# Patient Record
Sex: Female | Born: 1985 | Race: Black or African American | Hispanic: No | Marital: Married | Smoking: Never smoker
Health system: Southern US, Community
[De-identification: ages and names within clinical notes are randomized; demographics above are authoritative.]

## PROBLEM LIST (undated history)

## (undated) ENCOUNTER — Emergency Department (HOSPITAL_COMMUNITY): Payer: 59

## (undated) DIAGNOSIS — E119 Type 2 diabetes mellitus without complications: Secondary | ICD-10-CM

## (undated) DIAGNOSIS — Z789 Other specified health status: Secondary | ICD-10-CM

## (undated) DIAGNOSIS — Z9889 Other specified postprocedural states: Secondary | ICD-10-CM

## (undated) DIAGNOSIS — I10 Essential (primary) hypertension: Secondary | ICD-10-CM

## (undated) HISTORY — PX: NO PAST SURGERIES: SHX2092

---

## 2007-01-25 ENCOUNTER — Ambulatory Visit: Payer: Self-pay | Admitting: Obstetrics and Gynecology

## 2007-01-25 ENCOUNTER — Inpatient Hospital Stay (HOSPITAL_COMMUNITY): Admission: AD | Admit: 2007-01-25 | Discharge: 2007-01-27 | Payer: Self-pay | Admitting: Obstetrics and Gynecology

## 2007-10-02 ENCOUNTER — Emergency Department: Payer: Self-pay | Admitting: Emergency Medicine

## 2007-11-16 ENCOUNTER — Emergency Department: Payer: Self-pay | Admitting: Emergency Medicine

## 2008-08-05 ENCOUNTER — Inpatient Hospital Stay: Payer: Self-pay | Admitting: General Practice

## 2009-07-25 ENCOUNTER — Emergency Department: Payer: Self-pay | Admitting: Internal Medicine

## 2010-09-04 ENCOUNTER — Emergency Department (HOSPITAL_COMMUNITY)
Admission: EM | Admit: 2010-09-04 | Discharge: 2010-09-04 | Disposition: A | Discharge status: Home / Self Care | Payer: Self-pay | Attending: Emergency Medicine | Admitting: Emergency Medicine

## 2010-09-04 ENCOUNTER — Emergency Department (HOSPITAL_COMMUNITY): Payer: Self-pay

## 2010-09-04 DIAGNOSIS — B9789 Other viral agents as the cause of diseases classified elsewhere: Secondary | ICD-10-CM | POA: Insufficient documentation

## 2010-09-04 DIAGNOSIS — R0989 Other specified symptoms and signs involving the circulatory and respiratory systems: Secondary | ICD-10-CM | POA: Insufficient documentation

## 2010-09-04 DIAGNOSIS — J3489 Other specified disorders of nose and nasal sinuses: Secondary | ICD-10-CM | POA: Insufficient documentation

## 2010-09-04 DIAGNOSIS — R05 Cough: Secondary | ICD-10-CM | POA: Insufficient documentation

## 2010-09-04 DIAGNOSIS — H9209 Otalgia, unspecified ear: Secondary | ICD-10-CM | POA: Insufficient documentation

## 2010-09-04 DIAGNOSIS — R509 Fever, unspecified: Secondary | ICD-10-CM | POA: Insufficient documentation

## 2010-09-04 DIAGNOSIS — R079 Chest pain, unspecified: Secondary | ICD-10-CM | POA: Insufficient documentation

## 2010-09-04 DIAGNOSIS — J029 Acute pharyngitis, unspecified: Secondary | ICD-10-CM | POA: Insufficient documentation

## 2010-09-04 DIAGNOSIS — R0609 Other forms of dyspnea: Secondary | ICD-10-CM | POA: Insufficient documentation

## 2010-09-04 DIAGNOSIS — R059 Cough, unspecified: Secondary | ICD-10-CM | POA: Insufficient documentation

## 2010-09-04 LAB — GLUCOSE, CAPILLARY: Glucose-Capillary: 93 mg/dL (ref 70–99)

## 2010-11-04 ENCOUNTER — Emergency Department (HOSPITAL_COMMUNITY)
Admission: EM | Admit: 2010-11-04 | Discharge: 2010-11-04 | Disposition: A | Discharge status: Home / Self Care | Payer: Self-pay | Attending: Emergency Medicine | Admitting: Emergency Medicine

## 2010-11-04 DIAGNOSIS — N739 Female pelvic inflammatory disease, unspecified: Secondary | ICD-10-CM | POA: Insufficient documentation

## 2010-11-04 DIAGNOSIS — R109 Unspecified abdominal pain: Secondary | ICD-10-CM | POA: Insufficient documentation

## 2010-11-04 LAB — CBC
HCT: 37.4 % (ref 36.0–46.0)
Hemoglobin: 12 g/dL (ref 12.0–15.0)
MCH: 23.2 pg — ABNORMAL LOW (ref 26.0–34.0)
MCHC: 32.1 g/dL (ref 30.0–36.0)
MCV: 72.2 fL — ABNORMAL LOW (ref 78.0–100.0)
RDW: 13.3 % (ref 11.5–15.5)

## 2010-11-04 LAB — URINALYSIS, ROUTINE W REFLEX MICROSCOPIC
Bilirubin Urine: NEGATIVE
Hgb urine dipstick: NEGATIVE
Specific Gravity, Urine: 1.03 (ref 1.005–1.030)
Urobilinogen, UA: 1 mg/dL (ref 0.0–1.0)

## 2010-11-04 LAB — WET PREP, GENITAL: Trich, Wet Prep: NONE SEEN

## 2010-11-04 LAB — DIFFERENTIAL
Basophils Relative: 0 % (ref 0–1)
Eosinophils Absolute: 0.1 10*3/uL (ref 0.0–0.7)
Lymphocytes Relative: 21 % (ref 12–46)
Neutrophils Relative %: 73 % (ref 43–77)

## 2010-11-04 LAB — URINE MICROSCOPIC-ADD ON

## 2010-11-05 LAB — GC/CHLAMYDIA PROBE AMP, GENITAL
Chlamydia, DNA Probe: NEGATIVE
GC Probe Amp, Genital: NEGATIVE

## 2011-04-30 LAB — URINALYSIS, ROUTINE W REFLEX MICROSCOPIC
Bilirubin Urine: NEGATIVE
Nitrite: NEGATIVE
Specific Gravity, Urine: 1.015
Urobilinogen, UA: 2 — ABNORMAL HIGH
pH: 7

## 2011-04-30 LAB — CBC
Hemoglobin: 10.2 — ABNORMAL LOW
MCHC: 31.6
MCHC: 31.6
MCV: 71.8 — ABNORMAL LOW
MCV: 72.7 — ABNORMAL LOW
RBC: 4.43
RBC: 4.88
RDW: 14.1 — ABNORMAL HIGH
RDW: 14.9 — ABNORMAL HIGH

## 2011-04-30 LAB — COMPREHENSIVE METABOLIC PANEL
AST: 19
CO2: 24
Calcium: 8.7
Creatinine, Ser: 0.5
GFR calc Af Amer: >60
GFR calc non Af Amer: >60
Sodium: 134 — ABNORMAL LOW
Total Protein: 6.8

## 2011-04-30 LAB — URINE MICROSCOPIC-ADD ON

## 2011-04-30 LAB — RAPID URINE DRUG SCREEN, HOSP PERFORMED
Barbiturates: NOT DETECTED
Benzodiazepines: NOT DETECTED
Opiates: NOT DETECTED

## 2011-04-30 LAB — HEMOGLOBIN A1C: Hgb A1c MFr Bld: 6.5 — ABNORMAL HIGH

## 2011-04-30 LAB — LACTATE DEHYDROGENASE: LDH: 140

## 2014-08-10 LAB — OB RESULTS CONSOLE GC/CHLAMYDIA
Chlamydia: NEGATIVE
GC PROBE AMP, GENITAL: NEGATIVE

## 2014-08-10 LAB — OB RESULTS CONSOLE GBS: STREP GROUP B AG: NEGATIVE

## 2014-12-10 LAB — OB RESULTS CONSOLE VARICELLA ZOSTER ANTIBODY, IGG: VARICELLA IGG: IMMUNE

## 2014-12-10 LAB — OB RESULTS CONSOLE RUBELLA ANTIBODY, IGM: Rubella: IMMUNE

## 2014-12-10 LAB — OB RESULTS CONSOLE HIV ANTIBODY (ROUTINE TESTING): HIV: NONREACTIVE

## 2014-12-10 LAB — OB RESULTS CONSOLE RPR: RPR: NONREACTIVE

## 2014-12-10 LAB — OB RESULTS CONSOLE ABO/RH: RH TYPE: POSITIVE

## 2014-12-10 LAB — OB RESULTS CONSOLE GC/CHLAMYDIA
Chlamydia: NEGATIVE
Gonorrhea: NEGATIVE

## 2014-12-10 LAB — OB RESULTS CONSOLE HEPATITIS B SURFACE ANTIGEN: HEP B S AG: NEGATIVE

## 2015-03-21 ENCOUNTER — Ambulatory Visit: Payer: Self-pay | Admitting: *Deleted

## 2015-07-03 ENCOUNTER — Ambulatory Visit: Payer: No Typology Code available for payment source | Admitting: Dietician

## 2015-07-16 NOTE — L&D Delivery Note (Signed)
Delivery Note At 6:41 AM a viable female was delivered via Vaginal, Spontaneous Delivery (Presentation: OP to ROP).  APGAR: 8, 9; weight  .   Placenta status: Intact, Spontaneous.  Cord: 3 vessels with the following complications: None.  Cord pH: NA  Called to see patient.  Mom pushed to delivery viable female infant.  The head delivered followed by shoulders, which were tight but delivered within 30 seconds, and the rest of the body.  No nuchal cord noted.  Baby to mom's chest.  Cord clamped and cut after > 1 min delay.  Cord blood obtained.  Placenta delivered spontaneously, intact, with a 3-vessel cord.  All counts correct.  Hemostasis obtained with IV pitocin and fundal massage. EBL 175 mL.    Anesthesia: Epidural  Episiotomy: None Lacerations: None Est. Blood Loss (mL): 175  Mom to postpartum.  Baby to Couplet care / Skin to Skin.  Tresea Mall, CNM  Dr Elesa Massed was present for observation/assistance with the delivery.

## 2015-08-11 ENCOUNTER — Encounter: Payer: Self-pay | Admitting: *Deleted

## 2015-08-11 ENCOUNTER — Observation Stay
Admission: RE | Admit: 2015-08-11 | Discharge: 2015-08-11 | Disposition: A | Discharge status: Home / Self Care | Payer: Medicaid Other | Attending: Obstetrics and Gynecology | Admitting: Obstetrics and Gynecology

## 2015-08-11 DIAGNOSIS — O24419 Gestational diabetes mellitus in pregnancy, unspecified control: Secondary | ICD-10-CM | POA: Diagnosis present

## 2015-08-11 DIAGNOSIS — Z885 Allergy status to narcotic agent status: Secondary | ICD-10-CM | POA: Diagnosis not present

## 2015-08-11 DIAGNOSIS — Z3A36 36 weeks gestation of pregnancy: Secondary | ICD-10-CM | POA: Diagnosis not present

## 2015-08-11 DIAGNOSIS — O24410 Gestational diabetes mellitus in pregnancy, diet controlled: Principal | ICD-10-CM | POA: Insufficient documentation

## 2015-08-11 MED ORDER — LIDOCAINE HCL (PF) 1 % IJ SOLN: 30.0000 mL | INTRAMUSCULAR | Status: DC | PRN

## 2015-08-11 MED ORDER — ACETAMINOPHEN 325 MG PO TABS: 650.0000 mg | ORAL_TABLET | ORAL | Status: DC | PRN

## 2015-08-11 MED ORDER — CITRIC ACID-SODIUM CITRATE 334-500 MG/5ML PO SOLN: 30.0000 mL | ORAL | Status: DC | PRN

## 2015-08-11 NOTE — H&P (Addendum)
Obstetrics Admission History & Physical  08/11/2015 - 6:42 PM Primary OBGYN: [facility]  Chief Complaint: low normal AFI today in clinic, so patient sent to L&D for NST  History of Present Illness  29 y.o. [id number]@ 36w5 (EDC 2/19, LMP=7), with the above CC. Pregnancy complicated by: GDMa1 with early diagnosis this pregnancy, BMI 32, h/o pelvis fx after last delivery  Ms. DULCE MARTIAN states denies any s/s of SROM. Patient had growth u/s today that showed EFW 76%, 3356gm, AC 96%, AFI 5.2; in looking at the images it looks like there are two 2x2 pockets. She denies any PTL or decreased FM s/s. Her last growth u/s was on 11/30 with AFI 13, EFW 76% and large AC. Per note today, her BS log is normal, per patient report and that's what she endorses to me as well.   Review of Systems:  her 12 point review of systems is negative or as noted in the History of Present Illness.   PMHx: No past medical history on file. PSHx: No past surgical history on file. Medications:  No prescriptions prior to admission     Allergies: is allergic to morphine and related. OBHx: 01/2007 VD, 7lbs 6 oz. SAB x 1          FHx: No family history on file. Soc Hx:  Social History   Social History  . Marital Status: Single    Spouse Name: N/A  . Number of Children: N/A  . Years of Education: N/A   Occupational History  . Not on file.   Social History Main Topics  . Smoking status: Not on file  . Smokeless tobacco: Not on file  . Alcohol Use: Not on file  . Drug Use: Not on file  . Sexual Activity: Not on file   Other Topics Concern  . Not on file   Social History Narrative  . No narrative on file    Objective    Current Vital Signs 24h Vital Sign Ranges  T 98.3 F (36.8 C) Temp  Avg: 98.3 F (36.8 C)  Min: 98.3 F (36.8 C)  Max: 98.3 F (36.8 C)  BP 128/87 mmHg BP  Min: 128/87  Max: 128/87  HR 90 Pulse  Avg: 90  Min: 90  Max: 90  RR 16 Resp  Avg: 16  Min: 16  Max: 16  SaO2     No Data  Recorded       24 Hour I/O Current Shift I/O  Time Ins Outs       EFM: 135 baseline, +accels, no decel, mod var  Toco: quiet  General: Well nourished, well developed female in no acute distress.  Respiratory:   Normal respiratory effort Abdomen: gravid, nttp Neuro/Psych:  Normal mood and affect.   Labs  none  Radiology none   Assessment & Plan  30 y/o G3P1011 with GDMA1; pt doing well *IUP: rNST, fetal status reassuring *GDMa1: No mention in notes and EMR why patient was sent to L&D instead of NST in the office, which she needed anyway b/c she is 36wks with GDM. d/w pt that AFI is low normal but that it's still normal and that she has two 2x2 pockets and a very reactive NST. Given this, no s/s of fetal distress and okay to do her already scheduled Monday follow up. I told her that would do NST there and then repeat AFI on Thursday/Friday along with another NST. FKC precautions given. *h/o pelvis fx after VD: records appear requested  *  Dispo: home  [place] Bing, Montez Hageman. MD [facility] Pager [phone]

## 2015-08-11 NOTE — OB Triage Note (Signed)
Rcvd to OBS 1 after being seen in the office today.  Changed to gown and to bed.  EFM applied. Oriented to room plan of care discussed.  Verbalized understanding.

## 2015-08-17 ENCOUNTER — Inpatient Hospital Stay
Admission: RE | Admit: 2015-08-17 | Discharge: 2015-08-20 | DRG: 775 | Disposition: A | Discharge status: Home / Self Care | Payer: Medicaid Other | Source: Ambulatory Visit | Attending: Obstetrics & Gynecology | Admitting: Obstetrics & Gynecology

## 2015-08-17 DIAGNOSIS — D62 Acute posthemorrhagic anemia: Secondary | ICD-10-CM | POA: Diagnosis present

## 2015-08-17 DIAGNOSIS — O4103X0 Oligohydramnios, third trimester, not applicable or unspecified: Secondary | ICD-10-CM | POA: Diagnosis present

## 2015-08-17 DIAGNOSIS — J111 Influenza due to unidentified influenza virus with other respiratory manifestations: Secondary | ICD-10-CM | POA: Diagnosis present

## 2015-08-17 DIAGNOSIS — O9952 Diseases of the respiratory system complicating childbirth: Secondary | ICD-10-CM | POA: Diagnosis present

## 2015-08-17 DIAGNOSIS — O9902 Anemia complicating childbirth: Secondary | ICD-10-CM | POA: Diagnosis present

## 2015-08-17 DIAGNOSIS — O4100X0 Oligohydramnios, unspecified trimester, not applicable or unspecified: Secondary | ICD-10-CM

## 2015-08-17 DIAGNOSIS — Z3A37 37 weeks gestation of pregnancy: Secondary | ICD-10-CM

## 2015-08-17 DIAGNOSIS — O24420 Gestational diabetes mellitus in childbirth, diet controlled: Secondary | ICD-10-CM | POA: Diagnosis present

## 2015-08-17 HISTORY — DX: Oligohydramnios, unspecified trimester, not applicable or unspecified: O41.00X0

## 2015-08-17 HISTORY — DX: Other specified health status: Z78.9

## 2015-08-17 LAB — TYPE AND SCREEN
ABO/RH(D): O POS
ANTIBODY SCREEN: NEGATIVE

## 2015-08-17 LAB — CBC
HEMATOCRIT: 32.2 % — AB (ref 35.0–47.0)
HEMOGLOBIN: 10.1 g/dL — AB (ref 12.0–16.0)
MCH: 21.7 pg — ABNORMAL LOW (ref 26.0–34.0)
MCHC: 31.4 g/dL — ABNORMAL LOW (ref 32.0–36.0)
MCV: 69.2 fL — ABNORMAL LOW (ref 80.0–100.0)
Platelets: 249 10*3/uL (ref 150–440)
RBC: 4.66 MIL/uL (ref 3.80–5.20)
RDW: 15.1 % — ABNORMAL HIGH (ref 11.5–14.5)
WBC: 9.9 10*3/uL (ref 3.6–11.0)

## 2015-08-17 LAB — ABO/RH: ABO/RH(D): O POS

## 2015-08-17 MED ORDER — AMMONIA AROMATIC IN INHA
RESPIRATORY_TRACT | Status: AC
  Filled 2015-08-17: qty 10

## 2015-08-17 MED ORDER — LACTATED RINGERS IV SOLN
500.0000 mL | INTRAVENOUS | Status: DC | PRN
  Administered 2015-08-18: 500 mL via INTRAVENOUS

## 2015-08-17 MED ORDER — ZOLPIDEM TARTRATE 5 MG PO TABS: 5.0000 mg | ORAL_TABLET | Freq: Once | ORAL | Status: DC

## 2015-08-17 MED ORDER — OXYTOCIN BOLUS FROM INFUSION
500.0000 mL | INTRAVENOUS | Status: DC
  Administered 2015-08-18: 500 mL via INTRAVENOUS

## 2015-08-17 MED ORDER — MISOPROSTOL 200 MCG PO TABS
ORAL_TABLET | ORAL | Status: AC
  Administered 2015-08-17: 25 ug via ORAL
  Filled 2015-08-17: qty 4

## 2015-08-17 MED ORDER — TERBUTALINE SULFATE 1 MG/ML IJ SOLN: 0.2500 mg | Freq: Once | INTRAMUSCULAR | Status: DC | PRN

## 2015-08-17 MED ORDER — MISOPROSTOL 25 MCG QUARTER TABLET: 25.0000 ug | ORAL_TABLET | ORAL | Status: DC | PRN

## 2015-08-17 MED ORDER — MISOPROSTOL 25 MCG QUARTER TABLET
25.0000 ug | ORAL_TABLET | ORAL | Status: DC
  Administered 2015-08-17 (×2): 25 ug via ORAL
  Filled 2015-08-17: qty 0.25
  Filled 2015-08-17: qty 1

## 2015-08-17 MED ORDER — LIDOCAINE HCL (PF) 1 % IJ SOLN
30.0000 mL | INTRAMUSCULAR | Status: DC | PRN
  Filled 2015-08-17: qty 30

## 2015-08-17 MED ORDER — ONDANSETRON HCL 4 MG/2ML IJ SOLN: 4.0000 mg | Freq: Four times a day (QID) | INTRAMUSCULAR | Status: DC | PRN

## 2015-08-17 MED ORDER — ACETAMINOPHEN 325 MG PO TABS: 650.0000 mg | ORAL_TABLET | ORAL | Status: DC | PRN

## 2015-08-17 MED ORDER — OXYTOCIN 40 UNITS IN LACTATED RINGERS INFUSION - SIMPLE MED
2.5000 [IU]/h | INTRAVENOUS | Status: DC
  Administered 2015-08-18: 2.5 [IU]/h via INTRAVENOUS
  Filled 2015-08-17: qty 1000

## 2015-08-17 MED ORDER — LACTATED RINGERS IV SOLN
INTRAVENOUS | Status: DC
  Administered 2015-08-17 – 2015-08-18 (×3): via INTRAVENOUS

## 2015-08-17 NOTE — H&P (Signed)
OB History & Physical   History of Present Illness:  Chief Complaint: Oligohydramnios indication for delivery  HPI:  Diana [name] is a 30 y.o. G1P0 female at [redacted]w[redacted]d dated by LMP and U/S.  Her pregnancy has been complicated by Gestational Diabetes- diet controlled, abnormal fetal U/S- follow up with [facility] showed normal cardiac anatomy, and oligohydramnios.    She denies contractions.   She denies leakage of fluid.   She denies vaginal bleeding.   She reports fetal movement.    Maternal Medical History:  No past medical history on file.  No past surgical history on file.  Allergies  Allergen Reactions  . 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   Review of Systems: Negative x 10 systems reviewed except as noted in the HPI.    Physical Exam:  Vital Signs: There were no vitals taken for this visit. General: no acute distress.  HEENT: normocephalic, atraumatic Heart: regular rate & rhythm.  No murmurs/rubs/gallops Lungs: clear to auscultation bilaterally Abdomen: soft, gravid, non-tender;  EFW: 7.5 pounds Pelvic:   External: Normal external female genitalia  Cervix: Dilation: Fingertip / Effacement (%): Thick / Station: -3     Extremities: non-tender, symmetric, no edema bilaterally.  DTRs: 2+ bilaterally  Neurologic: Alert & oriented x 3.    Pertinent Results:  Prenatal Labs: Blood type/Rh O positive  Antibody screen negative  Rubella Immune  Varicella Immune    RPR negative  HBsAg negative  HIV negative  GC negative  Chlamydia negative  Genetic screening 1st  trimester negative  1 hour GTT Abnormal 174  3 hour GTT Abnormal: F:96, 1hr 110, 2hr 165, 3hr 169  GBS negative on 1/27   Baseline FHR: 150 beats/min   Variability: moderate   Accelerations: present   Decelerations: absent Contractions: absent  Overall assessment: Reassuring  Assessment:  Diana [name] is a 30 y.o. G72P1011 female at [redacted]w[redacted]d with Oligohydramnios at term.   Plan:  1. Admit to Labor & Delivery  2. CBC, T&S, Clrs, IVF 3. GBS Negative   4. Fetal well-being: Cat I tracing 5. Cytotec to begin induction of labor   Jakala Herford, CNM  This patient and plan were discussed with Dr Elesa Massed 08/17/2015

## 2015-08-17 NOTE — Progress Notes (Signed)
  Labor Progress Note   30 y.o. G9F6213 @ [redacted]w[redacted]d , admitted for  Pregnancy, Labor Management. IOL for oligohydramnios  Subjective:  Patient is beginning to feel a few small contractions. Requesting a sleep aid.  Objective:  BP 128/77 mmHg  Pulse 89  Temp(Src) 98.1 F (36.7 C) (Oral)  Resp 17  Ht  (1.727 m)  Wt 94.802 kg (209 lb)  BMI 31.79 kg/m2 Abd: mild Extr: trace to 1+ bilateral pedal edema  EFM: FHR: 135 bpm, variability: moderate,  accelerations:  Present,  decelerations:  Absent Toco: None   Assessment & Plan:  G3P1011 @ [redacted]w[redacted]d, admitted for  Pregnancy and Labor/Delivery Management  1. Pain management: epidural as needed 2. FWB: FHT category I.  3. ID: GBS negative 4. Labor management: cytotec for cervical ripening 5. Ambien  for sleep aid All discussed with patient, see orders  Letesha Klecker, CNM   This patient and plan were discussed with Dr Elesa Massed 08/17/2015

## 2015-08-18 ENCOUNTER — Inpatient Hospital Stay: Payer: Medicaid Other | Admitting: Anesthesiology

## 2015-08-18 ENCOUNTER — Encounter: Payer: Self-pay | Admitting: Anesthesiology

## 2015-08-18 LAB — GLUCOSE, CAPILLARY: Glucose-Capillary: 105 mg/dL — ABNORMAL HIGH (ref 65–99)

## 2015-08-18 LAB — PROTEIN / CREATININE RATIO, URINE
Creatinine, Urine: 111 mg/dL
PROTEIN CREATININE RATIO: 0.15 mg/mg{creat} (ref 0.00–0.15)
TOTAL PROTEIN, URINE: 17 mg/dL

## 2015-08-18 LAB — RPR: RPR: NONREACTIVE

## 2015-08-18 MED ORDER — NALOXONE HCL 2 MG/2ML IJ SOSY
1.0000 ug/kg/h | PREFILLED_SYRINGE | INTRAVENOUS | Status: DC | PRN
  Filled 2015-08-18: qty 2

## 2015-08-18 MED ORDER — TETANUS-DIPHTH-ACELL PERTUSSIS 5-2.5-18.5 LF-MCG/0.5 IM SUSP
0.5000 mL | Freq: Once | INTRAMUSCULAR | Status: AC
  Administered 2015-08-20: 0.5 mL via INTRAMUSCULAR
  Filled 2015-08-18: qty 0.5

## 2015-08-18 MED ORDER — DIPHENHYDRAMINE HCL 50 MG/ML IJ SOLN: 12.5000 mg | INTRAMUSCULAR | Status: DC | PRN

## 2015-08-18 MED ORDER — NALBUPHINE HCL 10 MG/ML IJ SOLN: 5.0000 mg | INTRAMUSCULAR | Status: DC | PRN

## 2015-08-18 MED ORDER — SENNOSIDES-DOCUSATE SODIUM 8.6-50 MG PO TABS
2.0000 | ORAL_TABLET | ORAL | Status: DC
  Administered 2015-08-18 – 2015-08-20 (×2): 2 via ORAL
  Filled 2015-08-18 (×2): qty 2

## 2015-08-18 MED ORDER — BENZOCAINE-MENTHOL 20-0.5 % EX AERO: 1.0000 "application " | INHALATION_SPRAY | CUTANEOUS | Status: DC | PRN

## 2015-08-18 MED ORDER — DIPHENHYDRAMINE HCL 25 MG PO CAPS: 25.0000 mg | ORAL_CAPSULE | ORAL | Status: DC | PRN

## 2015-08-18 MED ORDER — LIDOCAINE HCL (PF) 1 % IJ SOLN
INTRAMUSCULAR | Status: DC | PRN
  Administered 2015-08-18: 1 mL via INTRADERMAL

## 2015-08-18 MED ORDER — TERBUTALINE SULFATE 1 MG/ML IJ SOLN: 0.2500 mg | Freq: Once | INTRAMUSCULAR | Status: DC | PRN

## 2015-08-18 MED ORDER — OXYTOCIN 40 UNITS IN LACTATED RINGERS INFUSION - SIMPLE MED
1.0000 m[IU]/min | INTRAVENOUS | Status: DC
  Filled 2015-08-18: qty 1000

## 2015-08-18 MED ORDER — ONDANSETRON HCL 4 MG/2ML IJ SOLN: 4.0000 mg | INTRAMUSCULAR | Status: DC | PRN

## 2015-08-18 MED ORDER — SODIUM CHLORIDE 0.9% FLUSH: 3.0000 mL | INTRAVENOUS | Status: DC | PRN

## 2015-08-18 MED ORDER — DIPHENHYDRAMINE HCL 25 MG PO CAPS: 25.0000 mg | ORAL_CAPSULE | Freq: Four times a day (QID) | ORAL | Status: DC | PRN

## 2015-08-18 MED ORDER — DIBUCAINE 1 % RE OINT
1.0000 | TOPICAL_OINTMENT | RECTAL | Status: DC | PRN
Start: 2015-08-18 — End: 2015-08-20

## 2015-08-18 MED ORDER — ACETAMINOPHEN 325 MG PO TABS: 650.0000 mg | ORAL_TABLET | ORAL | Status: DC | PRN

## 2015-08-18 MED ORDER — NALBUPHINE HCL 10 MG/ML IJ SOLN: 5.0000 mg | Freq: Once | INTRAMUSCULAR | Status: DC | PRN

## 2015-08-18 MED ORDER — ONDANSETRON HCL 4 MG/2ML IJ SOLN: 4.0000 mg | Freq: Three times a day (TID) | INTRAMUSCULAR | Status: DC | PRN

## 2015-08-18 MED ORDER — FENTANYL CITRATE (PF) 100 MCG/2ML IJ SOLN
50.0000 ug | INTRAMUSCULAR | Status: DC | PRN
  Administered 2015-08-18: 50 ug via INTRAVENOUS
  Filled 2015-08-18: qty 2

## 2015-08-18 MED ORDER — IBUPROFEN 600 MG PO TABS
600.0000 mg | ORAL_TABLET | Freq: Four times a day (QID) | ORAL | Status: DC
  Administered 2015-08-18 – 2015-08-20 (×8): 600 mg via ORAL
  Filled 2015-08-18 (×9): qty 1

## 2015-08-18 MED ORDER — SIMETHICONE 80 MG PO CHEW: 80.0000 mg | CHEWABLE_TABLET | ORAL | Status: DC | PRN

## 2015-08-18 MED ORDER — FENTANYL 2.5 MCG/ML W/ROPIVACAINE 0.2% IN NS 100 ML EPIDURAL INFUSION (ARMC-ANES)
10.0000 mL/h | EPIDURAL | Status: DC
  Administered 2015-08-18: 10 mL/h via EPIDURAL

## 2015-08-18 MED ORDER — PRENATAL MULTIVITAMIN CH
1.0000 | ORAL_TABLET | Freq: Every day | ORAL | Status: DC
  Administered 2015-08-18 – 2015-08-20 (×3): 1 via ORAL
  Filled 2015-08-18 (×3): qty 1

## 2015-08-18 MED ORDER — WITCH HAZEL-GLYCERIN EX PADS
1.0000 | MEDICATED_PAD | CUTANEOUS | Status: DC | PRN
Start: 2015-08-18 — End: 2015-08-20

## 2015-08-18 MED ORDER — ONDANSETRON HCL 4 MG PO TABS: 4.0000 mg | ORAL_TABLET | ORAL | Status: DC | PRN

## 2015-08-18 MED ORDER — BUPIVACAINE HCL (PF) 0.25 % IJ SOLN
INTRAMUSCULAR | Status: DC | PRN
  Administered 2015-08-18: 5 mL via EPIDURAL

## 2015-08-18 MED ORDER — FENTANYL 2.5 MCG/ML W/ROPIVACAINE 0.2% IN NS 100 ML EPIDURAL INFUSION (ARMC-ANES)
EPIDURAL | Status: AC
Start: 2015-08-18 — End: 2015-08-18
  Filled 2015-08-18: qty 100

## 2015-08-18 MED ORDER — LIDOCAINE-EPINEPHRINE (PF) 1.5 %-1:200000 IJ SOLN
INTRAMUSCULAR | Status: DC | PRN
  Administered 2015-08-18: 3 mL via EPIDURAL

## 2015-08-18 MED ORDER — NALOXONE HCL 0.4 MG/ML IJ SOLN: 0.4000 mg | INTRAMUSCULAR | Status: DC | PRN

## 2015-08-18 MED ORDER — LANOLIN HYDROUS EX OINT: TOPICAL_OINTMENT | CUTANEOUS | Status: DC | PRN

## 2015-08-18 NOTE — Anesthesia Procedure Notes (Signed)
Epidural Patient location during procedure: OB Start time: 08/18/2015 3:13 AM End time: 08/18/2015 3:14 AM  Staffing Anesthesiologist: Margorie John K Performed by: anesthesiologist   Preanesthetic Checklist Completed: patient identified, site marked, surgical consent, pre-op evaluation, timeout performed, IV checked, risks and benefits discussed and monitors and equipment checked  Epidural Patient position: sitting Prep: Betadine Patient monitoring: heart rate, continuous pulse ox and blood pressure Approach: midline Location: L4-L5 Injection technique: LOR saline  Needle:  Needle type: Tuohy  Needle gauge: 18 G Needle length: 9 cm and 9 Needle insertion depth: 7 cm Catheter type: closed end flexible Catheter size: 20 Guage Catheter at skin depth: 11 cm Test dose: negative and 1.5% lidocaine with Epi 1:200 K  Assessment Sensory level: T8 Events: blood not aspirated, injection not painful, no injection resistance, negative IV test and no paresthesia  Additional Notes   Patient tolerated the insertion well without complications.Reason for block:procedure for pain

## 2015-08-18 NOTE — Anesthesia Preprocedure Evaluation (Signed)
Anesthesia Evaluation  Patient identified by MRN, date of birth, ID band Patient awake    Reviewed: Allergy & Precautions, H&P , NPO status , Patient's Chart, lab work & pertinent test results  Airway Mallampati: III  TM Distance: >3 FB Neck ROM: full    Dental  (+) Poor Dentition   Pulmonary neg pulmonary ROS,    Pulmonary exam normal breath sounds clear to auscultation       Cardiovascular Exercise Tolerance: Good (-) hypertensionnegative cardio ROS Normal cardiovascular exam Rhythm:regular Rate:Normal     Neuro/Psych negative neurological ROS  negative psych ROS   GI/Hepatic negative GI ROS, Neg liver ROS,   Endo/Other  negative endocrine ROS  Renal/GU negative Renal ROS  negative genitourinary   Musculoskeletal   Abdominal   Peds  Hematology negative hematology ROS (+)   Anesthesia Other Findings                                BMI    Body Mass Index   31.78 kg/m 2      Reproductive/Obstetrics (+) Pregnancy                             Anesthesia Physical Anesthesia Plan  ASA: II  Anesthesia Plan: Epidural   Post-op Pain Management:    Induction:   Airway Management Planned:   Additional Equipment:   Intra-op Plan:   Post-operative Plan:   Informed Consent: I have reviewed the patients History and Physical, chart, labs and discussed the procedure including the risks, benefits and alternatives for the proposed anesthesia with the patient or authorized representative who has indicated his/her understanding and acceptance.   Dental Advisory Given  Plan Discussed with: Anesthesiologist, CRNA and Surgeon  Anesthesia Plan Comments:         Anesthesia Quick Evaluation

## 2015-08-18 NOTE — Progress Notes (Signed)
  Labor Progress Note   30 y.o. [id number] @ [redacted]w[redacted]d , admitted for  Pregnancy, Labor Management. IOL for oligohydramnios  Subjective:  Patient is feeling contractions every 2 to 5 minutes and they have gotten stronger. She has not had ambien yet but may still request.  Objective:  BP 128/77 mmHg  Pulse 89  Temp(Src) 98.1 F (36.7 C) (Oral)  Resp 17  Ht  (1.727 m)  Wt 94.802 kg (209 lb)  BMI 31.79 kg/m2 Abd: mild Extr: trace to 1+ bilateral pedal edema  EFM: FHR: 130 bpm, variability: moderate,  accelerations:  Present,  decelerations:  Absent Toco: every 2-4 min Cervix: 4cm Membranes: AROM by Dr Elesa Massed for scant fluid  Assessment & Plan:  A5W0981 @ [redacted]w[redacted]d, admitted for  Pregnancy and Labor/Delivery Management  1. Pain management: epidural PRN 2. FWB: FHT category I.  3. ID: GBS negative 4. Labor management: AROM, Pitocin for augmentation PRN 5. Ambien  for sleep aid PRN All discussed with patient, see orders   Diana [name], CNM   This patient and plan were discussed with Dr Elesa Massed 08/18/2015

## 2015-08-18 NOTE — Discharge Summary (Addendum)
OB Discharge Summary  Patient Name: Diana [name] DOB: 10/13/1985 MRN: [id number]  Date of admission: 08/17/2015 Delivering MD: Thomasene Mohair, MD Date of Delivery: 08/20/2015  Date of discharge: 08/20/2015  Admitting diagnosis: Pregnancy, Oligohydramnios Intrauterine pregnancy: [redacted]w[redacted]d     Secondary diagnosis: Gestational Diabetes- diet controlled     Discharge diagnosis: Term Pregnancy Delivered                                                                                                Post partum procedures:Influenza and TDAP  Augmentation: AROM and Cytotec  Complications: None  Hospital course:  Induction of Labor With Vaginal Delivery   30 y.o. yo G3P2010 at [redacted]w[redacted]d was admitted to the hospital 08/17/2015 for induction of labor.  Indication for induction: Oligohydramnios.  Patient had an uncomplicated labor course as follows: Membrane Rupture Time/Date: 12:39 AM ,08/18/2015   Intrapartum Procedures: Episiotomy: None                                         Lacerations:  None   Patient had delivery of a Viable infant.  Information for the patient's newborn:  Makayli, Bracken [[id number]]  Delivery Method: Vaginal, Spontaneous Delivery (Filed from Delivery Summary)    08/18/2015  Details of delivery can be found in separate delivery note.  Patient had a routine postpartum course. Patient is discharged home 08/20/2015.   Physical exam  Filed Vitals:   08/19/15 1145 08/19/15 1630 08/19/15 2030 08/20/15 0812  BP:  132/85 126/74 121/82  Pulse:  73 64 80  Temp: 98 F (36.7 C) 98.3 F (36.8 C) 98.4 F (36.9 C) 98 F (36.7 C)  TempSrc: Oral Oral Oral Oral  Resp:  Height:      Weight:      SpO2:  100% 98% 100%   General: alert, cooperative and no distress Lochia: appropriate Uterine Fundus: firm DVT Evaluation: No evidence of DVT seen on physical exam.  Labs: Lab Results  Component Value Date   WBC 13.8* 08/19/2015   HGB 9.2* 08/19/2015   HCT  29.4* 08/19/2015   MCV 69.5* 08/19/2015   PLT 217 08/19/2015   CMP 01/25/2007  Glucose 90  BUN 3(L)  Creatinine 0.50  Sodium 134(L)  Potassium 3.6  Chloride 103  CO2 24  Calcium 8.7  Total Protein 6.8  Total Bilirubin 0.7  Alkaline Phos 293(H)  AST 19  ALT 11    Discharge instruction: per After Visit Summary.  Medications:    Medication List    TAKE these medications        ferrous sulfate 325 (65 FE) MG tablet  Commonly known as:  FERROUSUL  Take 1 tablet (325 mg total) by mouth daily.     ibuprofen 600 MG tablet  Commonly known as:  ADVIL,MOTRIN  Take 1 tablet (600 mg total) by mouth every 6 (six) hours.     multivitamin-prenatal 27-0.8 MG Tabs tablet  Take 1 tablet by mouth daily at 12 noon.  Diet: routine diet  Activity: Advance as tolerated. Pelvic rest for 6 weeks.   Outpatient follow up: No Follow-up on file.  Postpartum contraception: Depo Provera Rhogam Given postpartum: no Rubella vaccine given postpartum: no Varicella vaccine given postpartum: no TDaP given antepartum or postpartum: TDaP given 02/17/15 Flu vaccine given antepartum or postpartum: Yes  Newborn Data: Live born female  Birth Weight:   APGAR: 8, 9   Baby Feeding: Breast  Disposition:home with mother  Vena Austria

## 2015-08-19 LAB — CBC
HEMATOCRIT: 29.4 % — AB (ref 35.0–47.0)
HEMOGLOBIN: 9.2 g/dL — AB (ref 12.0–16.0)
MCH: 21.7 pg — AB (ref 26.0–34.0)
MCHC: 31.3 g/dL — AB (ref 32.0–36.0)
MCV: 69.5 fL — AB (ref 80.0–100.0)
Platelets: 217 10*3/uL (ref 150–440)
RBC: 4.23 MIL/uL (ref 3.80–5.20)
RDW: 15.6 % — ABNORMAL HIGH (ref 11.5–14.5)
WBC: 13.8 10*3/uL — ABNORMAL HIGH (ref 3.6–11.0)

## 2015-08-19 LAB — GLUCOSE, CAPILLARY: Glucose-Capillary: 77 mg/dL (ref 65–99)

## 2015-08-19 NOTE — Progress Notes (Signed)
Subjective:  No issues, doing well.  Minimal lochia.  No nausea or vomitting  Objective:   Blood pressure 117/77, pulse 60, temperature 97.8 F (36.6 C), temperature source Oral, resp. rate 18, height  (1.727 m), weight 94.802 kg (209 lb), SpO2 97 %, unknown if currently breastfeeding.  General: NAD Pulmonary: no increased work of breathing Abdomen: non-distended, non-tender, fundus firm at level of umbilicus Extremities: no edema, no erythema, no tenderness  Results for orders placed or performed during the hospital encounter of 08/17/15 (from the past 72 hour(s))  CBC     Status: Abnormal   Collection Time: 08/17/15  3:37 PM  Result Value Ref Range   WBC 9.9 3.6 - 11.0 K/uL   RBC 4.66 3.80 - 5.20 MIL/uL   Hemoglobin 10.1 (L) 12.0 - 16.0 g/dL   HCT 16.1 (L) 09.6 - 04.5 %   MCV 69.2 (L) 80.0 - 100.0 fL   MCH 21.7 (L) 26.0 - 34.0 pg   MCHC 31.4 (L) 32.0 - 36.0 g/dL   RDW 40.9 (H) 81.1 - 91.4 %   Platelets 249 150 - 440 K/uL  Type and screen [facility]     Status: None   Collection Time: 08/17/15  3:37 PM  Result Value Ref Range   ABO/RH(D) O POS    Antibody Screen NEG    Sample Expiration 08/20/2015   RPR     Status: None   Collection Time: 08/17/15  3:37 PM  Result Value Ref Range   RPR Ser Ql Non Reactive Non Reactive    Comment: (NOTE) Performed At: [facility] [address] [place], [place] [id number] Mila Homer MD [id number]   ABO/Rh     Status: None   Collection Time: 08/17/15  3:38 PM  Result Value Ref Range   ABO/RH(D) O POS   Protein / creatinine ratio, urine     Status: None   Collection Time: 08/18/15  3:33 AM  Result Value Ref Range   Creatinine, Urine 111 mg/dL   Total Protein, Urine 17 mg/dL    Comment: NO NORMAL RANGE ESTABLISHED FOR THIS TEST   Protein Creatinine Ratio 0.15 0.00 - 0.15 mg/mg[Cre]  Glucose, capillary     Status: Abnormal   Collection Time: 08/18/15  7:02 AM  Result Value Ref Range   Glucose-Capillary 105 (H) 65 - 99 mg/dL  CBC     Status: Abnormal   Collection Time: 08/19/15  6:34 AM  Result Value Ref Range   WBC 13.8 (H) 3.6 - 11.0 K/uL   RBC 4.23 3.80 - 5.20 MIL/uL   Hemoglobin 9.2 (L) 12.0 - 16.0 g/dL   HCT 52.8 (L) 41.3 - 24.4 %   MCV 69.5 (L) 80.0 - 100.0 fL   MCH 21.7 (L) 26.0 - 34.0 pg   MCHC 31.3 (L) 32.0 - 36.0 g/dL   RDW 01.0 (H) 27.2 - 53.6 %   Platelets 217 150 - 440 K/uL  Glucose, capillary     Status: None   Collection Time: 08/19/15  7:10 AM  Result Value Ref Range   Glucose-Capillary 77 65 - 99 mg/dL    Assessment:   30 y.o. [id number] postpartum day #1 TSVD, oligo and GDM  Plan:    1) Acute blood loss anemia - hemodynamically stable and asymptomatic - po ferrous sulfate  2) --/--/O POS (02/02 1538) / Rubella Immune / Varicella Immune  3) TDAP and influenza prior to discharge   4) Breast/Depo  5) Disposition -  anticipate discharge PPD2, will need 2-hr OGTT at time of 6 week postpartum visit

## 2015-08-19 NOTE — Discharge Instructions (Signed)
Call your doctor for increased pain or vaginal bleeding, temperature above 100.4, depression, or concerns.  No strenuous activity or heavy lifting for 6 weeks.  No intercourse, tampons, douching, or enemas for 6 weeks.  No tub baths-showers only.  No driving for 2 weeks or while taking pain medications.  Continue prenatal vitamin and iron.  Increase calories and fluids while breastfeeding. °

## 2015-08-19 NOTE — Anesthesia Post-op Follow-up Note (Signed)
  Anesthesia Pain Follow-up Note  Patient: Diana [name] [facility]  Day #: 1  Date of Follow-up: 08/19/2015 Time: 8:56 AM  Last Vitals:  Filed Vitals:   08/19/15 0316 08/19/15 0814  BP: 125/82 117/77  Pulse: 79 60  Temp: 36.7 C 36.6 C  Resp:  18    Level of Consciousness: alert  Pain: mild   Side Effects:None  Catheter Site Exam:clean, dry, no drainage  Plan: D/C from anesthesia care  Cleda Mccreedy Piscitello

## 2015-08-19 NOTE — Anesthesia Postprocedure Evaluation (Signed)
Anesthesia Post Note  Patient: Diana [name]  Procedure(s) Performed: * No procedures listed *  Patient location during evaluation: Mother Baby Anesthesia Type: Epidural Level of consciousness: awake and alert Pain management: pain level controlled Vital Signs Assessment: post-procedure vital signs reviewed and stable Respiratory status: spontaneous breathing, nonlabored ventilation and respiratory function stable Cardiovascular status: stable Postop Assessment: no headache, no backache and epidural receding Anesthetic complications: no    Last Vitals:  Filed Vitals:   08/19/15 0316 08/19/15 0814  BP: 125/82 117/77  Pulse: 79 60  Temp: 36.7 C 36.6 C  Resp:  18    Last Pain:  Filed Vitals:   08/19/15 0829  PainSc: 0-No pain                 Cleda Mccreedy Piscitello

## 2015-08-20 MED ORDER — INFLUENZA VAC SPLIT QUAD 0.5 ML IM SUSY: 0.5000 mL | PREFILLED_SYRINGE | Freq: Once | INTRAMUSCULAR | Status: DC

## 2015-08-20 MED ORDER — MEDROXYPROGESTERONE ACETATE 150 MG/ML IM SUSP
150.0000 mg | Freq: Once | INTRAMUSCULAR | Status: AC
  Administered 2015-08-20: 150 mg via INTRAMUSCULAR
  Filled 2015-08-20: qty 1

## 2015-08-20 MED ORDER — FERROUS SULFATE 325 (65 FE) MG PO TABS: 325.0000 mg | ORAL_TABLET | Freq: Every day | ORAL | Status: DC

## 2015-08-20 MED ORDER — IBUPROFEN 600 MG PO TABS: 600.0000 mg | ORAL_TABLET | Freq: Four times a day (QID) | ORAL | Status: DC

## 2015-08-20 MED ORDER — INFLUENZA VAC SPLIT QUAD 0.5 ML IM SUSY
0.5000 mL | PREFILLED_SYRINGE | Freq: Once | INTRAMUSCULAR | Status: AC
  Administered 2015-08-20: 0.5 mL via INTRAMUSCULAR
  Filled 2015-08-20: qty 0.5

## 2015-08-20 NOTE — Progress Notes (Signed)
Discharge instructions provided.  Pt and sig other verbalize understanding of all instructions and follow-up care.  Prescriptions given.  Pt discharged to home with infant at 1410 on 08/20/15 via wheelchair by RN. Reynold Bowen, RN 08/20/2015 8:43 PM

## 2016-07-15 NOTE — L&D Delivery Note (Signed)
Delivery Note At 9:22 PM a viable and healthy female "Diana [name]" was delivered via Vaginal, Spontaneous Delivery (Presentation:ROA ;  ).  APGAR: 9,9 ; weight  pending Placenta status: intact, .  Cord: 3vc with the following complications: none .    Anesthesia:  epidural Episiotomy:  none Lacerations:  none Est. Blood Loss (mL):  300  Mom to postpartum.  Baby to Couplet care / Skin to Skin.  30yo [id number] at 37+4wks with iol for gDMA2, on glucostabilizer while in labor. Clear fluid with AROM. Delivery of fetal head followed by slow anterior shoulder, reduced with McRoberts and suprapubic pressure and Ritgens maneuver. Immediately vigorous and crying, placed on maternal abdomen. Placenta delivered spontaneously intact. Bleeding normal.  Planned breast and bottle; would like IUD.  Diana [name] 12/29/2016, 9:30 PM

## 2016-07-26 ENCOUNTER — Encounter (HOSPITAL_COMMUNITY): Payer: Self-pay | Admitting: Emergency Medicine

## 2016-07-26 ENCOUNTER — Emergency Department (HOSPITAL_COMMUNITY)
Admission: EM | Admit: 2016-07-26 | Discharge: 2016-07-26 | Disposition: A | Discharge status: Home / Self Care | Payer: Medicaid Other | Attending: Emergency Medicine | Admitting: Emergency Medicine

## 2016-07-26 DIAGNOSIS — Z5321 Procedure and treatment not carried out due to patient leaving prior to being seen by health care provider: Secondary | ICD-10-CM | POA: Diagnosis not present

## 2016-07-26 DIAGNOSIS — Z79899 Other long term (current) drug therapy: Secondary | ICD-10-CM | POA: Insufficient documentation

## 2016-07-26 DIAGNOSIS — R109 Unspecified abdominal pain: Secondary | ICD-10-CM | POA: Diagnosis present

## 2016-07-26 LAB — URINALYSIS, ROUTINE W REFLEX MICROSCOPIC
BILIRUBIN URINE: NEGATIVE
Glucose, UA: NEGATIVE mg/dL
HGB URINE DIPSTICK: NEGATIVE
Ketones, ur: 5 mg/dL — AB
NITRITE: NEGATIVE
PROTEIN: NEGATIVE mg/dL
Specific Gravity, Urine: 1.019 (ref 1.005–1.030)
pH: 5 (ref 5.0–8.0)

## 2016-07-26 LAB — CBC
HEMATOCRIT: 37.3 % (ref 36.0–46.0)
HEMOGLOBIN: 12.6 g/dL (ref 12.0–15.0)
MCH: 23.7 pg — ABNORMAL LOW (ref 26.0–34.0)
MCHC: 33.8 g/dL (ref 30.0–36.0)
MCV: 70.2 fL — ABNORMAL LOW (ref 78.0–100.0)
Platelets: 286 10*3/uL (ref 150–400)
RBC: 5.31 MIL/uL — ABNORMAL HIGH (ref 3.87–5.11)
RDW: 13.7 % (ref 11.5–15.5)
WBC: 13.1 10*3/uL — AB (ref 4.0–10.5)

## 2016-07-26 LAB — COMPREHENSIVE METABOLIC PANEL
ALBUMIN: 3.5 g/dL (ref 3.5–5.0)
ALT: 10 U/L — ABNORMAL LOW (ref 14–54)
ANION GAP: 8 (ref 5–15)
AST: 16 U/L (ref 15–41)
Alkaline Phosphatase: 40 U/L (ref 38–126)
BILIRUBIN TOTAL: 0.5 mg/dL (ref 0.3–1.2)
BUN: <5 mg/dL — ABNORMAL LOW (ref 6–20)
CO2: 21 mmol/L — ABNORMAL LOW (ref 22–32)
Calcium: 9.1 mg/dL (ref 8.9–10.3)
Chloride: 106 mmol/L (ref 101–111)
Creatinine, Ser: 0.45 mg/dL (ref 0.44–1.00)
GFR calc Af Amer: >60 mL/min (ref >60)
GLUCOSE: 105 mg/dL — AB (ref 65–99)
POTASSIUM: 3.9 mmol/L (ref 3.5–5.1)
Sodium: 135 mmol/L (ref 135–145)
TOTAL PROTEIN: 7.4 g/dL (ref 6.5–8.1)

## 2016-07-26 LAB — LIPASE, BLOOD: LIPASE: 22 U/L (ref 11–51)

## 2016-07-26 LAB — I-STAT BETA HCG BLOOD, ED (MC, WL, AP ONLY)

## 2016-07-26 NOTE — ED Notes (Addendum)
Critical result reported to [place]ren[place], [place]

## 2016-07-26 NOTE — ED Notes (Signed)
Pt did not answer when called for room 

## 2016-07-26 NOTE — ED Triage Notes (Signed)
Pt presents to ED for assessment after having a positive pregnancy test x 2 weeks.  Pt is on Depo and cannot recall last period.  Pt now c/o severe abdominal cramping.  "It feels like menstrual cramps, but really bad".

## 2016-07-26 NOTE — ED Notes (Signed)
Pt has been called three times with no response.

## 2016-08-05 ENCOUNTER — Encounter: Payer: Self-pay | Admitting: Emergency Medicine

## 2016-08-05 ENCOUNTER — Emergency Department: Payer: Medicaid Other

## 2016-08-05 ENCOUNTER — Emergency Department
Admission: EM | Admit: 2016-08-05 | Discharge: 2016-08-05 | Disposition: A | Discharge status: Home / Self Care | Payer: Medicaid Other | Attending: Emergency Medicine | Admitting: Emergency Medicine

## 2016-08-05 DIAGNOSIS — R102 Pelvic and perineal pain: Secondary | ICD-10-CM | POA: Diagnosis not present

## 2016-08-05 DIAGNOSIS — O26892 Other specified pregnancy related conditions, second trimester: Secondary | ICD-10-CM | POA: Diagnosis present

## 2016-08-05 DIAGNOSIS — Z3A16 16 weeks gestation of pregnancy: Secondary | ICD-10-CM | POA: Diagnosis not present

## 2016-08-05 DIAGNOSIS — Z791 Long term (current) use of non-steroidal anti-inflammatories (NSAID): Secondary | ICD-10-CM | POA: Insufficient documentation

## 2016-08-05 DIAGNOSIS — Z3492 Encounter for supervision of normal pregnancy, unspecified, second trimester: Secondary | ICD-10-CM

## 2016-08-05 DIAGNOSIS — O26899 Other specified pregnancy related conditions, unspecified trimester: Secondary | ICD-10-CM

## 2016-08-05 LAB — URINALYSIS, COMPLETE (UACMP) WITH MICROSCOPIC
BILIRUBIN URINE: NEGATIVE
Glucose, UA: NEGATIVE mg/dL
Hgb urine dipstick: NEGATIVE
Ketones, ur: NEGATIVE mg/dL
Nitrite: NEGATIVE
Protein, ur: NEGATIVE mg/dL
SPECIFIC GRAVITY, URINE: 1.02 (ref 1.005–1.030)
pH: 6 (ref 5.0–8.0)

## 2016-08-05 LAB — POCT PREGNANCY, URINE: PREG TEST UR: POSITIVE — AB

## 2016-08-05 LAB — HCG, QUANTITATIVE, PREGNANCY: HCG, BETA CHAIN, QUANT, S: 40305 m[IU]/mL — AB (ref <5)

## 2016-08-05 NOTE — ED Provider Notes (Signed)
[facility] Emergency Department Provider Note    First MD Initiated Contact with Patient 08/05/16 1126     (approximate)  I have reviewed the triage vital signs and the nursing notes.   HISTORY  Chief Complaint Abdominal Pain    HPI Diana [name] is a 31 y.o. female G2 P2 last month her period September 2017 presents to the emergency department with suprapubic discomfort times approximately one week. Patient denies any vaginal bleeding. Patient states that she took 3 home pregnancy tests which were all positive.   Past Medical History:  Diagnosis Date  . Medical history non-contributory     Patient Active Problem List   Diagnosis Date Noted  . Postpartum care following vaginal delivery 08/18/2015  . Oligohydramnios in third trimester, antepartum 08/17/2015  . Labor and delivery indication for care or intervention 08/17/2015  . Oligohydramnios, antepartum 08/17/2015  . Gestational diabetes mellitus (GDM) affecting pregnancy 08/11/2015    Past Surgical History:  Procedure Laterality Date  . NO PAST SURGERIES      Prior to Admission medications   Medication Sig Start Date End Date Taking? Authorizing Provider  ferrous sulfate (FERROUSUL) 325 (65 FE) MG tablet Take 1 tablet (325 mg total) by mouth daily. 08/20/15   Vena AustriaAndreas Staebler, MD  ibuprofen (ADVIL,MOTRIN) 600 MG tablet Take 1 tablet (600 mg total) by mouth every 6 (six) hours. 08/20/15   Vena AustriaAndreas Staebler, MD  Prenatal Vit-Fe Fumarate-FA (MULTIVITAMIN-PRENATAL) 27-0.8 MG TABS tablet Take 1 tablet by mouth daily at 12 noon.    Historical Provider, MD    Allergies Morphine and related  Family History  Problem Relation Age of Onset  . Diabetes Paternal Grandmother   . Hypertension Paternal Grandmother     Social History Social History  Substance Use Topics  . Smoking status: Never Smoker  . Smokeless tobacco: Never Used  . Alcohol use No    Review of Systems Constitutional:  No fever/chills Eyes: No visual changes. ENT: No sore throat. Cardiovascular: Denies chest pain. Respiratory: Denies shortness of breath. Gastrointestinal: No abdominal pain.  No nausea, no vomiting.  No diarrhea.  No constipation. Genitourinary: Negative for dysuria. Positive for pelvic pain Musculoskeletal: Negative for back pain. Skin: Negative for rash. Neurological: Negative for headaches, focal weakness or numbness.  10-point ROS otherwise negative.  ____________________________________________   PHYSICAL EXAM:  VITAL SIGNS: ED Triage Vitals  Enc Vitals Group     BP 08/05/16 0944 125/89     Pulse Rate 08/05/16 0944 (!) 104     Resp 08/05/16 0944 15     Temp 08/05/16 0944 98.6 F (37 C)     Temp Source 08/05/16 0944 Oral     SpO2 08/05/16 0944 100 %     Weight 08/05/16 0945 185 lb (83.9 kg)     Height 08/05/16 0945 5\' 8"  (1.727 m)     Head Circumference --      Peak Flow --      Pain Score 08/05/16 0951 7     Pain Loc --      Pain Edu? --      Excl. in GC? --     Constitutional: Alert and oriented. Well appearing and in no acute distress. Eyes: Conjunctivae are normal. PERRL. EOMI. Head: Atraumatic. Mouth/Throat: Mucous membranes are moist.  Oropharynx non-erythematous. Neck: No stridor.   Cardiovascular: Normal rate, regular rhythm. Good peripheral circulation. Grossly normal heart sounds. Respiratory: Normal respiratory effort.  No retractions. Lungs CTAB. Gastrointestinal: Soft and nontender.  No distention.   Musculoskeletal: No lower extremity tenderness nor edema. No gross deformities of extremities. Neurologic:  Normal speech and language. No gross focal neurologic deficits are appreciated.  Skin:  Skin is warm, dry and intact. No rash noted. Psychiatric: Mood and affect are normal. Speech and behavior are normal.  ____________________________________________   LABS (all labs ordered are listed, but only abnormal results are displayed)  Labs Reviewed   URINALYSIS, COMPLETE (UACMP) WITH MICROSCOPIC - Abnormal; Notable for the following:       Result Value   Color, Urine YELLOW (*)    APPearance CLEAR (*)    Leukocytes, UA LARGE (*)    Bacteria, UA RARE (*)    Squamous Epithelial / LPF 6-30 (*)    All other components within normal limits  HCG, QUANTITATIVE, PREGNANCY - Abnormal; Notable for the following:    hCG, Beta Chain, Quant, S 40,305 (*)    All other components within normal limits  POCT PREGNANCY, URINE - Abnormal; Notable for the following:    Preg Test, Ur POSITIVE (*)    All other components within normal limits  POC URINE PREG, ED    RADIOLOGY I, [place], personally viewed and evaluated these images (plain radiographs) as part of my medical decision making, as well as reviewing the written report by the radiologist. CLINICAL DATA:  Pelvic pain and cramping beginning this morning. [redacted] weeks gestational age by LMP.  EXAM: LIMITED OBSTETRIC ULTRASOUND  FINDINGS: Number of Fetuses: 1  Heart Rate:  140 bpm  Movement: Yes  Presentation: Transverse with head to maternal left  Placental Location: Anterior  Previa: No  Amniotic Fluid (Subjective):  Within normal limits.  BPD:  3.5cm 16w  5d  MATERNAL FINDINGS:  Cervix:  Appears closed.  Uterus/Adnexae: A small anechoic fluid collection is seen along the inferior margin of the placenta, suspicious for a chronic marginal placental abruption.  IMPRESSION: Single living IUP at approximately [redacted] weeks gestational age. 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 No other acute maternal findings identified.  This exam is performed on an emergent basis and does not comprehensively evaluate fetal size, dating, or anatomy; follow-up complete OB US should be considered if further fetal assessment is warranted.   Electronically Signed   By: Myles Rosenthal M.D.   On: 08/05/2016  12:56  ____________________________________________    Procedures     INITIAL IMPRESSION / ASSESSMENT AND PLAN / ED COURSE  Pertinent labs & imaging results that were available during my care of the patient were reviewed by me and considered in my medical decision making (see chart for details).  Spoke with the patient at length regarding ultrasound findings of a "probable chronic marginal placental abruption". Patient given warning signs are warm and return to the emergency department immediately. Patient will follow-up with Dr. Elesa Massed OB/GYN in 24 hours.      ____________________________________________  FINAL CLINICAL IMPRESSION(S) / ED DIAGNOSES  Final diagnoses:  Second trimester pregnancy     MEDICATIONS GIVEN DURING THIS VISIT:  Medications - No data to display   NEW OUTPATIENT MEDICATIONS STARTED DURING THIS VISIT:  New Prescriptions   No medications on file    Modified Medications   No medications on file    Discontinued Medications   No medications on file     Note:  This document was prepared using Dragon voice recognition software and may include unintentional dictation errors.    Darci Current, MD 08/07/16 [id number]

## 2016-08-05 NOTE — ED Notes (Signed)
Patient is complaining of lower abdominal pain radiating into her back.  No obvious distress at this time.

## 2016-08-05 NOTE — ED Triage Notes (Signed)
Pt with abd cramping and lower back pain this am. Pt is pregnant but does not know when her last regular menstrual cycle was due to breastfeeding. Pt denies any vaginal bleeding.

## 2016-08-15 LAB — OB RESULTS CONSOLE RPR: RPR: NONREACTIVE

## 2016-08-15 LAB — OB RESULTS CONSOLE VARICELLA ZOSTER ANTIBODY, IGG: Varicella: IMMUNE

## 2016-08-15 LAB — OB RESULTS CONSOLE RUBELLA ANTIBODY, IGM: RUBELLA: IMMUNE

## 2016-08-15 LAB — OB RESULTS CONSOLE HEPATITIS B SURFACE ANTIGEN: HEP B S AG: NEGATIVE

## 2016-08-15 LAB — OB RESULTS CONSOLE HIV ANTIBODY (ROUTINE TESTING): HIV: NONREACTIVE

## 2016-10-14 ENCOUNTER — Ambulatory Visit: Payer: Medicaid Other | Attending: Obstetrics and Gynecology | Admitting: Physical Therapy

## 2016-12-16 ENCOUNTER — Encounter: Payer: Medicaid Other | Attending: Obstetrics & Gynecology | Admitting: Dietician

## 2016-12-16 ENCOUNTER — Encounter: Payer: Self-pay | Admitting: Dietician

## 2016-12-16 VITALS — BP 104/78 | Ht 68.0 in | Wt 197.0 lb

## 2016-12-16 DIAGNOSIS — O24419 Gestational diabetes mellitus in pregnancy, unspecified control: Secondary | ICD-10-CM | POA: Diagnosis present

## 2016-12-16 DIAGNOSIS — O24414 Gestational diabetes mellitus in pregnancy, insulin controlled: Secondary | ICD-10-CM

## 2016-12-16 NOTE — Patient Instructions (Addendum)
Read booklet on Gestational Diabetes Follow Gestational Meal Planning Guidelines Complete a 3 Day Food Record and bring to next appointment Check blood sugars 4 x day - before breakfast and 2 hrs after every meal and record  Bring blood sugar log to all appointments Walk 20-30 minutes at least 5 x week if permitted by MD Start insulin TODAY per written MD orders:  N 8 units and R 5 units before breakfast and supper                                                                          R 5 units before lunch                                                                         N 8 units and R 5 units before breakfast and supper Carry glucose tablets or candy at all times Rotate sites for injections Next appointment:   12-20-16

## 2016-12-16 NOTE — Progress Notes (Signed)
Appt. Start Time: 1100 Appt. End Time: 1230  GDM Class 1 Diabetes Overview - define DM; state own type of DM; identify functions of pancreas and insulin; define insulin deficiency vs insulin resistance  Psychosocial - identify DM as a source of stress; state the effects of stress on BG control; verbalize appropriate stress management techniques; identify personal stress issues   Nutritional Management - describe effects of food on blood glucose; identify sources of carbohydrate, protein and fat; verbalize the importance of balance meals in controlling blood glucose; identify meals as well balanced or not; estimate servings of carbohydrate from menus; use food labels to identify servings size, content of carbohydrate, fiber, protein, fat, saturated fat and sodium; recognize food sources of fat, saturated fat, trans fat, sodium and verbalize goals for intake; describe healthful appropriate food choices when dining out   Exercise - describe the effects of exercise on blood glucose and importance of regular exercise in controlling diabetes; state a plan for personal exercise; verbalize contraindications for exercise  Medications - state name, dose, timing of currently prescribed medications; describe types of medications available for diabetes  Insulin Training - prepare and administer insulin accurately; state correct rotation pattern; verbalize safe and lawful needle disposal  Self-Monitoring - state importance of HBGM and demo procedure accurately; use HBGM results to effectively manage diabetes; identify importance of regular HbA1C testing and goals for results  Acute Complications/Sick Day Guidelines - recognize  hypoglycemia with causes and effects; identify blood glucose results as high, low or in control; list steps in treating and preventing low blood glucose; state appropriate measure to manage blood glucose when ill (need for meds, HBGM plan, when to call physician, need for  fluids)  Chronic Complications/Foot, Skin, Eye Dental Care - identify possible long-term complications of diabetes (retinopathy, neuropathy, nephropathy, cardiovascular disease, infections); explain steps in prevention and treatment of chronic complications; state importance of daily self-foot exams; describe how to examine feet and what to look for; explain appropriate eye and dental care  Lifestyle Changes/Goals & Health/Community Resources - state benefits of making appropriate lifestyle changes; identify habits that need to change (meals, tobacco, alcohol); identify strategies to reduce risk factors for personal health; set goals for proper diabetes care; state need for and frequency of healthcare follow-up; describe appropriate community resources for good health (ADA, web sites, apps)   Pregnancy/Sexual Health - define gestational diabetes; state importance of good blood glucose control and birth control prior to pregnancy; state importance of good blood glucose control in preventing sexual problems (impotence, vaginal dryness, infections, loss of desire); state relationship of blood glucose control and pregnancy outcome; describe risk of maternal and fetal complications  Teaching Materials Used:  General Meal Planning Guidelines Daily Food Record Gestational Diabetes Booklet Gestational Video Goals for Healthy Pregnancy 1 sample pack of insulin syringes 1 sample pack of glucose tablets BD Getting Started booklet

## 2016-12-17 ENCOUNTER — Other Ambulatory Visit: Payer: Self-pay | Admitting: Obstetrics & Gynecology

## 2016-12-20 ENCOUNTER — Ambulatory Visit: Payer: Medicaid Other | Admitting: Dietician

## 2016-12-24 ENCOUNTER — Telehealth: Payer: Self-pay | Admitting: Dietician

## 2016-12-24 NOTE — Telephone Encounter (Signed)
Called patient to check on progress and reschedule RD visit if needed, as she missed her RD appointment on 12/20/16. Requested a call back if needed. She will be induced within the next week, so unlikely to be able to complete the program.

## 2016-12-25 LAB — OB RESULTS CONSOLE RPR: RPR: NONREACTIVE

## 2016-12-25 LAB — OB RESULTS CONSOLE GBS: GBS: POSITIVE

## 2016-12-25 LAB — OB RESULTS CONSOLE GC/CHLAMYDIA
CHLAMYDIA, DNA PROBE: NEGATIVE
Gonorrhea: NEGATIVE

## 2016-12-27 ENCOUNTER — Encounter: Payer: Self-pay | Admitting: Dietician

## 2016-12-27 NOTE — Progress Notes (Signed)
Set discharge letter to MD. Induction is scheduled for 6/16 or 12/29/16.

## 2016-12-28 ENCOUNTER — Inpatient Hospital Stay
Admit: 2016-12-28 | Discharge: 2016-12-31 | DRG: 775 | Disposition: A | Discharge status: Home / Self Care | Payer: Medicaid Other | Attending: Obstetrics & Gynecology | Admitting: Obstetrics & Gynecology

## 2016-12-28 DIAGNOSIS — O24424 Gestational diabetes mellitus in childbirth, insulin controlled: Principal | ICD-10-CM | POA: Diagnosis present

## 2016-12-28 DIAGNOSIS — O99824 Streptococcus B carrier state complicating childbirth: Secondary | ICD-10-CM | POA: Diagnosis present

## 2016-12-28 DIAGNOSIS — Z3A37 37 weeks gestation of pregnancy: Secondary | ICD-10-CM | POA: Diagnosis not present

## 2016-12-28 LAB — GLUCOSE, CAPILLARY: Glucose-Capillary: 93 mg/dL (ref 65–99)

## 2016-12-28 MED ORDER — DEXTROSE 50 % IV SOLN: 25.0000 mL | INTRAVENOUS | Status: DC | PRN

## 2016-12-28 MED ORDER — LIDOCAINE HCL (PF) 1 % IJ SOLN
30.0000 mL | INTRAMUSCULAR | Status: AC | PRN
  Administered 2016-12-29: 1.2 mL via SUBCUTANEOUS

## 2016-12-28 MED ORDER — ACETAMINOPHEN 325 MG PO TABS: 650.0000 mg | ORAL_TABLET | ORAL | Status: DC | PRN

## 2016-12-28 MED ORDER — ONDANSETRON HCL 4 MG/2ML IJ SOLN
4.0000 mg | Freq: Four times a day (QID) | INTRAMUSCULAR | Status: DC | PRN
  Administered 2016-12-29: 4 mg via INTRAVENOUS
  Filled 2016-12-28: qty 2

## 2016-12-28 MED ORDER — MISOPROSTOL 25 MCG QUARTER TABLET
25.0000 ug | ORAL_TABLET | ORAL | Status: DC | PRN
  Administered 2016-12-29: 25 ug via VAGINAL
  Filled 2016-12-28 (×2): qty 1

## 2016-12-28 MED ORDER — LACTATED RINGERS IV SOLN
500.0000 mL | INTRAVENOUS | Status: DC | PRN
  Administered 2016-12-29: 1000 mL via INTRAVENOUS

## 2016-12-28 MED ORDER — SODIUM CHLORIDE 0.9 % IV SOLN
1.0000 g | INTRAVENOUS | Status: DC
  Administered 2016-12-29 (×4): 1 g via INTRAVENOUS
  Filled 2016-12-28 (×7): qty 1000

## 2016-12-28 MED ORDER — SODIUM CHLORIDE 0.9 % IV SOLN
2.0000 g | Freq: Once | INTRAVENOUS | Status: AC
  Administered 2016-12-29: 2 g via INTRAVENOUS
  Filled 2016-12-28: qty 2000

## 2016-12-28 MED ORDER — INSULIN REGULAR BOLUS VIA INFUSION
0.0000 [IU] | Freq: Three times a day (TID) | INTRAVENOUS | Status: DC
  Filled 2016-12-28: qty 10

## 2016-12-28 MED ORDER — SODIUM CHLORIDE 0.9 % IV SOLN
INTRAVENOUS | Status: DC
  Administered 2016-12-29: 01:00:00 via INTRAVENOUS
  Filled 2016-12-28: qty 1

## 2016-12-28 MED ORDER — TERBUTALINE SULFATE 1 MG/ML IJ SOLN: 0.2500 mg | Freq: Once | INTRAMUSCULAR | Status: DC | PRN

## 2016-12-28 MED ORDER — LACTATED RINGERS IV SOLN
INTRAVENOUS | Status: DC
  Administered 2016-12-29 (×3): via INTRAVENOUS

## 2016-12-28 MED ORDER — SOD CITRATE-CITRIC ACID 500-334 MG/5ML PO SOLN
30.0000 mL | ORAL | Status: DC | PRN
Start: 2016-12-28 — End: 2016-12-30
  Filled 2016-12-28: qty 30

## 2016-12-28 MED ORDER — DEXTROSE-NACL 5-0.45 % IV SOLN
INTRAVENOUS | Status: DC
  Administered 2016-12-29: 01:00:00 via INTRAVENOUS

## 2016-12-28 MED ORDER — SODIUM CHLORIDE 0.9 % IV SOLN: INTRAVENOUS | Status: DC

## 2016-12-29 ENCOUNTER — Inpatient Hospital Stay: Payer: Medicaid Other | Admitting: Anesthesiology

## 2016-12-29 LAB — CBC
HCT: 29.7 % — ABNORMAL LOW (ref 35.0–47.0)
Hemoglobin: 9.6 g/dL — ABNORMAL LOW (ref 12.0–16.0)
MCH: 21.8 pg — AB (ref 26.0–34.0)
MCHC: 32.3 g/dL (ref 32.0–36.0)
MCV: 67.5 fL — AB (ref 80.0–100.0)
PLATELETS: 236 10*3/uL (ref 150–440)
RBC: 4.4 MIL/uL (ref 3.80–5.20)
RDW: 15.7 % — AB (ref 11.5–14.5)
WBC: 11.6 10*3/uL — AB (ref 3.6–11.0)

## 2016-12-29 LAB — GLUCOSE, CAPILLARY
GLUCOSE-CAPILLARY: 102 mg/dL — AB (ref 65–99)
GLUCOSE-CAPILLARY: 113 mg/dL — AB (ref 65–99)
GLUCOSE-CAPILLARY: 116 mg/dL — AB (ref 65–99)
GLUCOSE-CAPILLARY: 77 mg/dL (ref 65–99)
GLUCOSE-CAPILLARY: 82 mg/dL (ref 65–99)
GLUCOSE-CAPILLARY: 86 mg/dL (ref 65–99)
GLUCOSE-CAPILLARY: 93 mg/dL (ref 65–99)
GLUCOSE-CAPILLARY: 96 mg/dL (ref 65–99)
GLUCOSE-CAPILLARY: 98 mg/dL (ref 65–99)
Glucose-Capillary: 97 mg/dL (ref 65–99)
Glucose-Capillary: 104 mg/dL — ABNORMAL HIGH (ref 65–99)
Glucose-Capillary: 97 mg/dL (ref 65–99)
Glucose-Capillary: 122 mg/dL — ABNORMAL HIGH (ref 65–99)
Glucose-Capillary: 96 mg/dL (ref 65–99)
Glucose-Capillary: 88 mg/dL (ref 65–99)
Glucose-Capillary: 87 mg/dL (ref 65–99)

## 2016-12-29 LAB — TYPE AND SCREEN
ABO/RH(D): O POS
ANTIBODY SCREEN: NEGATIVE

## 2016-12-29 MED ORDER — LIDOCAINE-EPINEPHRINE (PF) 1.5 %-1:200000 IJ SOLN
INTRAMUSCULAR | Status: DC | PRN
  Administered 2016-12-29: 3 mL via EPIDURAL

## 2016-12-29 MED ORDER — OXYTOCIN 40 UNITS IN LACTATED RINGERS INFUSION - SIMPLE MED: 1.0000 m[IU]/min | INTRAVENOUS | Status: DC

## 2016-12-29 MED ORDER — PHENYLEPHRINE 40 MCG/ML (10ML) SYRINGE FOR IV PUSH (FOR BLOOD PRESSURE SUPPORT)
80.0000 ug | PREFILLED_SYRINGE | INTRAVENOUS | Status: DC | PRN
  Filled 2016-12-29: qty 5

## 2016-12-29 MED ORDER — FENTANYL 2.5 MCG/ML W/ROPIVACAINE 0.2% IN NS 100 ML EPIDURAL INFUSION (ARMC-ANES)
EPIDURAL | Status: AC
  Filled 2016-12-29: qty 100

## 2016-12-29 MED ORDER — FENTANYL 2.5 MCG/ML W/ROPIVACAINE 0.15% IN NS 100 ML EPIDURAL (ARMC)
12.0000 mL/h | EPIDURAL | Status: DC
  Filled 2016-12-29: qty 100

## 2016-12-29 MED ORDER — OXYTOCIN 40 UNITS IN LACTATED RINGERS INFUSION - SIMPLE MED
INTRAVENOUS | Status: AC
  Filled 2016-12-29: qty 1000

## 2016-12-29 MED ORDER — EPHEDRINE 5 MG/ML INJ
10.0000 mg | INTRAVENOUS | Status: DC | PRN
  Filled 2016-12-29: qty 2

## 2016-12-29 MED ORDER — OXYTOCIN BOLUS FROM INFUSION
500.0000 mL | Freq: Once | INTRAVENOUS | Status: AC
  Administered 2016-12-29: 500 mL via INTRAVENOUS

## 2016-12-29 MED ORDER — LIDOCAINE HCL (PF) 1 % IJ SOLN
INTRAMUSCULAR | Status: AC
  Filled 2016-12-29: qty 30

## 2016-12-29 MED ORDER — DIPHENHYDRAMINE HCL 50 MG/ML IJ SOLN: 12.5000 mg | INTRAMUSCULAR | Status: DC | PRN

## 2016-12-29 MED ORDER — OXYTOCIN 40 UNITS IN LACTATED RINGERS INFUSION - SIMPLE MED: 2.5000 [IU]/h | INTRAVENOUS | Status: DC

## 2016-12-29 MED ORDER — FENTANYL 2.5 MCG/ML W/ROPIVACAINE 0.15% IN NS 100 ML EPIDURAL (ARMC)
EPIDURAL | Status: DC | PRN
  Administered 2016-12-29: 12 mL/h via EPIDURAL

## 2016-12-29 MED ORDER — OXYTOCIN 10 UNIT/ML IJ SOLN
INTRAMUSCULAR | Status: AC
  Filled 2016-12-29: qty 2

## 2016-12-29 MED ORDER — MISOPROSTOL 200 MCG PO TABS
ORAL_TABLET | ORAL | Status: AC
  Filled 2016-12-29: qty 4

## 2016-12-29 MED ORDER — BUTORPHANOL TARTRATE 1 MG/ML IJ SOLN
1.0000 mg | INTRAMUSCULAR | Status: DC | PRN
  Administered 2016-12-29: 1 mg via INTRAVENOUS
  Filled 2016-12-29: qty 2

## 2016-12-29 MED ORDER — AMMONIA AROMATIC IN INHA
RESPIRATORY_TRACT | Status: AC
  Filled 2016-12-29: qty 10

## 2016-12-29 MED ORDER — SODIUM CHLORIDE 0.9 % IV SOLN
INTRAVENOUS | Status: AC
  Filled 2016-12-29: qty 2000

## 2016-12-29 MED ORDER — LACTATED RINGERS IV SOLN: 500.0000 mL | Freq: Once | INTRAVENOUS | Status: DC

## 2016-12-29 MED ORDER — TERBUTALINE SULFATE 1 MG/ML IJ SOLN: 0.2500 mg | Freq: Once | INTRAMUSCULAR | Status: DC | PRN

## 2016-12-29 NOTE — Progress Notes (Signed)
Santo HeldShalanda J Covington is a 31 y.o. [phone] at 6565w4d, iol for gDMA2, on glucostabilizer but not needed insulin today  Subjective: comfortable  Objective: BP 122/84 (BP Location: Right Arm)   Pulse 83   Temp 97.9 F (36.6 C) (Oral)   Resp 16   Ht 5\' 8"  (1.727 m)   Wt 90.3 kg (199 lb)   LMP  (LMP Unknown)   BMI 30.26 kg/m  I/O last 3 completed shifts: In: 909.7 [I.V.:859.7; IV Piggyback:50] Out: -  No intake/output data recorded.  FHT:  FHR: 120 bpm, variability: moderate,  accelerations:  Present,  decelerations:  Absent UC:   regular, every 5 minutes SVE:   Dilation: 2.5 Effacement (%): 50  Labs: Lab Results  Component Value Date   WBC 11.6 (H) 12/28/2016   HGB 9.6 (L) 12/28/2016   HCT 29.7 (L) 12/28/2016   MCV 67.5 (L) 12/28/2016   PLT 236 12/28/2016    Assessment / Plan:  - AROM for clear fluid - start pitocin - epidural when desired  Christeen DouglasBethany Vanesha [place] 12/29/2016, 12:07 PM

## 2016-12-29 NOTE — Discharge Summary (Signed)
Obstetrical Discharge Summary  Patient Name: Diana [name] DOB: 06/22/1986 MRN: [id number]  Date of Admission: 12/28/2016 Date of Discharge: 12/31/2016  [facility]   Gestational Age at Delivery: 5461w4d   Antepartum complications:  - gDMA2 - fetal echogenic cardiac focus: s/p pediatric cardiology, normal, no further f/u needed - short interval pregnancy - poor compliance this pregnancy - GBS positive  Admitting Diagnosis:  Secondary Diagnosis: Patient Active Problem List   Diagnosis Date Noted  . Postpartum care following vaginal delivery 08/18/2015  . Oligohydramnios in third trimester, antepartum 08/17/2015  . Labor and delivery indication for care or intervention 08/17/2015  . Oligohydramnios, antepartum 08/17/2015  . Gestational diabetes mellitus (GDM) affecting pregnancy 08/11/2015    Augmentation: AROM and Cytotec Complications: None Intrapartum complications/course: 38JW [id number] G4P2012 at 37+4wks with iol for gDMA2, on glucostabilizer while in labor. Received cytotec overnight, with good response. Clear fluid with AROM. Delivery of fetal head followed by slow anterior shoulder, reduced with McRoberts and suprapubic pressure and Ritgens maneuver. Immediately vigorous and crying, placed on maternal abdomen. Placenta delivered spontaneously intact. Bleeding normal.  Date of Delivery: 12/29/16  Delivered By: Christeen DouglasBethany Beasley  Delivery Type: spontaneous vaginal delivery Anesthesia: epidural Placenta: Spontaneous Laceration: none Episiotomy: none Newborn Data: Live born female "Maurine MinisterDennis" Birth Weight:   APGAR:9, 9    Discharge Physical Exam:   BP 133/80 (BP Location: Left Arm)   Pulse 78   Temp 98.1 F (36.7 C) (Oral)   Resp 18   Ht 5\' 8"  (1.727 m)   Wt 90.3 kg (199 lb)   LMP  (LMP Unknown)   SpO2 100%   Breastfeeding? Unknown   BMI 30.26 kg/m   General: NAD CV: RRR Pulm: CTABL, nl effort ABD: s/nd/nt, fundus firm and below the  umbilicus Lochia: moderate DVT Evaluation: LE non-ttp, no evidence of DVT on exam.  Hemoglobin  Date Value Ref Range Status  12/30/2016 8.7 (L) 12.0 - 16.0 g/dL Final   HCT  Date Value Ref Range Status  12/30/2016 27.6 (L) 35.0 - 47.0 % Final    Post partum course: routine Postpartum Procedures: none Disposition: stable, discharge to home. Baby Feeding: breastmilk and formula Baby Disposition: home with mom  Rh Immune globulin given:n/a Rubella vaccine given: n/a Tdap vaccine given in AP or PP setting: AP Flu vaccine given in AP or PP setting: n/a  Contraception: Desires IUD  Prenatal Labs:  O+ /RI / VI  /Hep B- / HIV NR /GBS+   Plan:  Diana [name] was discharged to home in good condition. [facility] delivering provider in 6 week   Discharge Medications: Allergies as of 12/31/2016      Reactions   Morphine And Related Rash      Medication List    STOP taking these medications   insulin NPH Human 100 UNIT/ML injection Commonly known as:  HUMULIN N,NOVOLIN N   insulin regular 100 units/mL injection Commonly known as:  NOVOLIN R,HUMULIN R   loratadine 10 MG tablet Commonly known as:  CLARITIN     TAKE these medications   ferrous sulfate 325 (65 FE) MG tablet Commonly known as:  FERROUSUL Take 1 tablet (325 mg total) by mouth daily.   ibuprofen 600 MG tablet Commonly known as:  ADVIL,MOTRIN Take 1 tablet (600 mg total) by mouth every 6 (six) hours. What changed:  when to take this  reasons to take this   multivitamin-prenatal 27-0.8 MG Tabs tablet Take 1 tablet  by mouth daily at 12 noon.       Follow-up Information    Christeen Douglas, MD Follow up in 6 week(s).   Specialty:  Obstetrics and Gynecology Why:  will need a 2 hour glucose testing prior to appointment.   Contact information: [address] [place] [address] [phone]           Signed: ----- Ranae Plumber,  MD Attending Obstetrician and Gynecologist [facility], Department of OB/GYN [facility]

## 2016-12-29 NOTE — OB Triage Note (Signed)
Scheduled IOL for GDM- insulin dependent.

## 2016-12-29 NOTE — H&P (Signed)
OB ADMISSION/ HISTORY & PHYSICAL:  Admission Date: 12/28/2016 10:36 PM  Admit Diagnosis: GDMA2 on insulin, poorly compliant according to prenatal notes.  Diana [name] is a 31 y.o. female presenting for iol for gDMA2 at 37+3wks.  Prenatal History: [id number]   EDC : 01/15/2017, Date entered prior to episode creation  Prenatal care at [facility]  Prenatal course complicated by  - gDMA2: On  - fetal echogenic cardiac focus: s/p pediatric cardiology, normal, no further f/u needed - short interval pregnancy - poor compliance this pregnancy - GBS positive - desires BTL  Prenatal Labs: ABO, Rh: --/--/O POS (06/17 4540) Antibody: NEG (06/17 0052) Rubella: Immune (02/01 0000)  RPR: Nonreactive (06/13 0000)  HBsAg: Negative (02/01 0000)  HIV: Non-reactive (02/01 0000)  GTT: failed GBS: Positive (06/13 0000)   Medical / Surgical History :  Past medical history:  Past Medical History:  Diagnosis Date  . Medical history non-contributory      Past surgical history:  Past Surgical History:  Procedure Laterality Date  . NO PAST SURGERIES      Family History:  Family History  Problem Relation Age of Onset  . Diabetes Paternal Grandmother   . Hypertension Paternal Grandmother      Social History:  reports that she has never smoked. She has never used smokeless tobacco. She reports that she does not drink alcohol or use drugs.   Allergies: Morphine and related    Current Medications at time of admission:  Prior to Admission medications   Medication Sig Start Date End Date Taking? Authorizing Provider  ferrous sulfate (FERROUSUL) 325 (65 FE) MG tablet Take 1 tablet (325 mg total) by mouth daily. Patient not taking: Reported on 12/16/2016 08/20/15   Vena Austria, MD  ibuprofen (ADVIL,MOTRIN) 600 MG tablet Take 1 tablet (600 mg total) by mouth every 6 (six) hours. Patient taking differently: Take 600 mg by mouth every 6 (six) hours as needed.  08/20/15   Vena Austria, MD   insulin NPH Human (HUMULIN N,NOVOLIN N) 100 UNIT/ML injection Inject 8 Units into the skin 2 (two) times daily before a meal. 12/16/16   [provider]  insulin regular (NOVOLIN R,HUMULIN R) 100 units/mL injection Inject 5 Units into the skin 3 (three) times daily before meals. 12/16/16 12/16/17  [provider]  loratadine (CLARITIN) 10 MG tablet Take 10 mg by mouth daily.    [provider]  Prenatal Vit-Fe Fumarate-FA (MULTIVITAMIN-PRENATAL) 27-0.8 MG TABS tablet Take 1 tablet by mouth daily at 12 noon.    [provider]     Review of Systems: Active FM  Physical Exam:  VS: Blood pressure 122/84, pulse 83, temperature 97.9 F (36.6 C), temperature source Oral, resp. rate 16, height 5\' 8"  (1.727 m), weight 90.3 kg (199 lb), not currently breastfeeding.  General: alert and oriented, appears NAD Heart: RRR Lungs: Clear lung fields Abdomen: Gravid, soft and non-tender, non-distended Extremities:  edema  Genitalia / VE: Dilation: 2.5 Effacement (%): 50  FHR: baseline rate 115 / variability mod / accelerations + / - decelerations TOCO: q3 min  Last [place] 12/19/16: Growth: EFW= 6lbs14oz = 53% for 37.2wks. AFI=17.89cm @ 67%. FHR=147BPM. Position=Vertex. Plac=Ant normal anatomy  Assessment: 37+[redacted] weeks gestation 1 stage of labor FHR category Cat I   Plan:  Epidural when desired Continuous fetal monitoring  1. Fetal Well being  - Fetal Tracing: Cat I - Ultrasound:  reviewed, as above - Group B Streptococcus: positive - Presentation: vtx confirmed by leopolds  2. Routine OB: - Prenatal labs reviewed, as above - Rh +/O positive  3. Induction of Labor:  -  Contractions external toco in place -  Plan for induction with cytotec  4. Post Partum Planning: - Infant feeding: breast and bottle - Contraception: BTL papers signed 11/20/16

## 2016-12-29 NOTE — Anesthesia Procedure Notes (Signed)
Epidural Patient location during procedure: OB Start time: 12/29/2016 1:10 PM End time: 12/29/2016 1:39 PM  Staffing Performed: anesthesiologist   Preanesthetic Checklist Completed: patient identified, site marked, surgical consent, pre-op evaluation, timeout performed, IV checked, risks and benefits discussed and monitors and equipment checked  Epidural Patient position: sitting Prep: Betadine Patient monitoring: heart rate, continuous pulse ox and blood pressure Approach: midline Location: L4-L5 Injection technique: LOR saline  Needle:  Needle type: Tuohy  Needle gauge: 17 G Needle length: 9 cm and 9 Needle insertion depth: 8 cm Catheter type: closed end flexible Catheter size: 20 Guage Catheter at skin depth: 10 cm Test dose: negative and 1.5% lidocaine with Epi 1:200 K  Assessment Events: blood not aspirated, injection not painful, no injection resistance, negative IV test and no paresthesia  Additional Notes   Patient tolerated the insertion well without complications.Reason for block:procedure for pain

## 2016-12-29 NOTE — Anesthesia Preprocedure Evaluation (Signed)
Anesthesia Evaluation  Patient identified by MRN, date of birth, ID band Patient awake    Reviewed: Allergy & Precautions, NPO status , Patient's Chart, lab work & pertinent test results  History of Anesthesia Complications Negative for: history of anesthetic complications  Airway Mallampati: II       Dental   Pulmonary neg pulmonary ROS,           Cardiovascular negative cardio ROS       Neuro/Psych negative neurological ROS     GI/Hepatic negative GI ROS, Neg liver ROS,   Endo/Other  diabetes, Gestational  Renal/GU negative Renal ROS     Musculoskeletal   Abdominal   Peds  Hematology   Anesthesia Other Findings   Reproductive/Obstetrics                             Anesthesia Physical Anesthesia Plan  ASA: II  Anesthesia Plan: Epidural   Post-op Pain Management:    Induction:   PONV Risk Score and Plan:   Airway Management Planned:   Additional Equipment:   Intra-op Plan:   Post-operative Plan:   Informed Consent: I have reviewed the patients History and Physical, chart, labs and discussed the procedure including the risks, benefits and alternatives for the proposed anesthesia with the patient or authorized representative who has indicated his/her understanding and acceptance.     Plan Discussed with:   Anesthesia Plan Comments:         Anesthesia Quick Evaluation

## 2016-12-30 LAB — CBC
HEMATOCRIT: 27.6 % — AB (ref 35.0–47.0)
Hemoglobin: 8.7 g/dL — ABNORMAL LOW (ref 12.0–16.0)
MCH: 21.5 pg — ABNORMAL LOW (ref 26.0–34.0)
MCHC: 31.5 g/dL — ABNORMAL LOW (ref 32.0–36.0)
MCV: 68.2 fL — ABNORMAL LOW (ref 80.0–100.0)
Platelets: 231 10*3/uL (ref 150–440)
RBC: 4.04 MIL/uL (ref 3.80–5.20)
RDW: 15.2 % — ABNORMAL HIGH (ref 11.5–14.5)
WBC: 13.3 10*3/uL — ABNORMAL HIGH (ref 3.6–11.0)

## 2016-12-30 LAB — RPR: RPR: NONREACTIVE

## 2016-12-30 MED ORDER — ONDANSETRON HCL 4 MG/2ML IJ SOLN: 4.0000 mg | INTRAMUSCULAR | Status: DC | PRN

## 2016-12-30 MED ORDER — SODIUM CHLORIDE 0.9% FLUSH
3.0000 mL | Freq: Two times a day (BID) | INTRAVENOUS | Status: DC
  Administered 2016-12-30: 3 mL via INTRAVENOUS

## 2016-12-30 MED ORDER — TETANUS-DIPHTH-ACELL PERTUSSIS 5-2.5-18.5 LF-MCG/0.5 IM SUSP: 0.5000 mL | Freq: Once | INTRAMUSCULAR | Status: DC

## 2016-12-30 MED ORDER — DIPHENHYDRAMINE HCL 25 MG PO CAPS: 25.0000 mg | ORAL_CAPSULE | Freq: Four times a day (QID) | ORAL | Status: DC | PRN

## 2016-12-30 MED ORDER — ONDANSETRON HCL 4 MG PO TABS: 4.0000 mg | ORAL_TABLET | ORAL | Status: DC | PRN

## 2016-12-30 MED ORDER — BENZOCAINE-MENTHOL 20-0.5 % EX AERO: 1.0000 "application " | INHALATION_SPRAY | CUTANEOUS | Status: DC | PRN

## 2016-12-30 MED ORDER — SODIUM CHLORIDE 0.9% FLUSH: 3.0000 mL | INTRAVENOUS | Status: DC | PRN

## 2016-12-30 MED ORDER — WITCH HAZEL-GLYCERIN EX PADS: 1.0000 "application " | MEDICATED_PAD | CUTANEOUS | Status: DC | PRN

## 2016-12-30 MED ORDER — ACETAMINOPHEN 325 MG PO TABS
650.0000 mg | ORAL_TABLET | ORAL | Status: DC | PRN
  Administered 2016-12-30: 650 mg via ORAL
  Filled 2016-12-30: qty 2

## 2016-12-30 MED ORDER — BISACODYL 10 MG RE SUPP: 10.0000 mg | Freq: Every day | RECTAL | Status: DC | PRN

## 2016-12-30 MED ORDER — FLEET ENEMA 7-19 GM/118ML RE ENEM: 1.0000 | ENEMA | Freq: Every day | RECTAL | Status: DC | PRN

## 2016-12-30 MED ORDER — SIMETHICONE 80 MG PO CHEW: 80.0000 mg | CHEWABLE_TABLET | ORAL | Status: DC | PRN

## 2016-12-30 MED ORDER — PRENATAL MULTIVITAMIN CH
1.0000 | ORAL_TABLET | Freq: Every day | ORAL | Status: DC
  Administered 2016-12-30 – 2016-12-31 (×2): 1 via ORAL
  Filled 2016-12-30 (×2): qty 1

## 2016-12-30 MED ORDER — IBUPROFEN 600 MG PO TABS
600.0000 mg | ORAL_TABLET | Freq: Four times a day (QID) | ORAL | Status: DC
  Administered 2016-12-30 – 2016-12-31 (×7): 600 mg via ORAL
  Filled 2016-12-30 (×7): qty 1

## 2016-12-30 MED ORDER — ZOLPIDEM TARTRATE 5 MG PO TABS: 5.0000 mg | ORAL_TABLET | Freq: Every evening | ORAL | Status: DC | PRN

## 2016-12-30 MED ORDER — MEASLES, MUMPS & RUBELLA VAC ~~LOC~~ INJ: 0.5000 mL | INJECTION | Freq: Once | SUBCUTANEOUS | Status: DC

## 2016-12-30 MED ORDER — SODIUM CHLORIDE 0.9 % IV SOLN: 250.0000 mL | INTRAVENOUS | Status: DC | PRN

## 2016-12-30 MED ORDER — DIBUCAINE 1 % RE OINT: 1.0000 "application " | TOPICAL_OINTMENT | RECTAL | Status: DC | PRN

## 2016-12-30 MED ORDER — SENNOSIDES-DOCUSATE SODIUM 8.6-50 MG PO TABS
2.0000 | ORAL_TABLET | ORAL | Status: DC
  Administered 2016-12-30 (×2): 2 via ORAL
  Filled 2016-12-30 (×2): qty 2

## 2016-12-30 MED ORDER — COCONUT OIL OIL: 1.0000 "application " | TOPICAL_OIL | Status: DC | PRN

## 2016-12-30 NOTE — Anesthesia Postprocedure Evaluation (Signed)
Anesthesia Post Note  Patient: Diana [name]  Procedure(s) Performed: * No procedures listed *  Patient location during evaluation: Mother Baby Anesthesia Type: Epidural Level of consciousness: awake and alert Pain management: pain level controlled Vital Signs Assessment: post-procedure vital signs reviewed and stable Respiratory status: spontaneous breathing, nonlabored ventilation and respiratory function stable Cardiovascular status: stable Postop Assessment: no headache, no backache and epidural receding Anesthetic complications: no     Last Vitals:  Vitals:   12/30/16 0340 12/30/16 0711  BP: 112/76 136/80  Pulse: 84 88  Resp: 18 20  Temp: 36.9 C 37.1 C    Last Pain:  Vitals:   12/30/16 0750  TempSrc:   PainSc: 0-No pain                 Mathews ArgyleLogan,  Jaiden Wahab P

## 2016-12-30 NOTE — Progress Notes (Signed)
Post Partum Day 1 Subjective: Feeling well  Objective: Blood pressure 136/80, pulse 88, temperature 98.7 F (37.1 C), temperature source Oral, resp. rate 20, height 5\' 8"  (1.727 m), weight 199 lb (90.3 kg), SpO2 100 %, unknown if currently breastfeeding.  Physical Exam:  General: alert and cooperative  Lungs: CTA bilat, no W/R/R. CV:S1S2, RRR, No M/R/G Lochia: appropriate, no clots Uterine Fundus: firm, -1 U DVT Evaluation: Neg Homans   Recent Labs  12/28/16 2356 12/30/16 0629  HGB 9.6* 8.7*  HCT 29.7* 27.6*  WBC 11.6* 13.3*  PLT 236 231    Assessment/Plan: A:1. PPD#1 stable P: DC on am 2. Plans Mirena IUD   LOS: 2 days   Sharee PimpleCaron W Jones 12/30/2016, 8:50 AM

## 2016-12-31 NOTE — Discharge Instructions (Signed)

## 2019-09-20 ENCOUNTER — Ambulatory Visit: Payer: Medicaid Other

## 2020-01-31 ENCOUNTER — Emergency Department (HOSPITAL_COMMUNITY)
Admission: EM | Admit: 2020-01-31 | Discharge: 2020-01-31 | Disposition: A | Discharge status: Home / Self Care | Payer: Managed Care, Other (non HMO) | Attending: Emergency Medicine | Admitting: Emergency Medicine

## 2020-01-31 ENCOUNTER — Emergency Department (HOSPITAL_COMMUNITY): Payer: Managed Care, Other (non HMO)

## 2020-01-31 ENCOUNTER — Encounter (HOSPITAL_COMMUNITY): Payer: Self-pay

## 2020-01-31 DIAGNOSIS — R110 Nausea: Secondary | ICD-10-CM | POA: Insufficient documentation

## 2020-01-31 DIAGNOSIS — R079 Chest pain, unspecified: Secondary | ICD-10-CM

## 2020-01-31 DIAGNOSIS — R0789 Other chest pain: Secondary | ICD-10-CM | POA: Diagnosis present

## 2020-01-31 LAB — BASIC METABOLIC PANEL
Anion gap: 9 (ref 5–15)
BUN: 9 mg/dL (ref 6–20)
CO2: 26 mmol/L (ref 22–32)
Calcium: 9.2 mg/dL (ref 8.9–10.3)
Chloride: 103 mmol/L (ref 98–111)
Creatinine, Ser: 0.65 mg/dL (ref 0.44–1.00)
GFR calc Af Amer: >60 mL/min (ref >60)
GFR calc non Af Amer: >60 mL/min (ref >60)
Glucose, Bld: 142 mg/dL — ABNORMAL HIGH (ref 70–99)
Potassium: 3.8 mmol/L (ref 3.5–5.1)
Sodium: 138 mmol/L (ref 135–145)

## 2020-01-31 LAB — CBC
HCT: 41.9 % (ref 36.0–46.0)
Hemoglobin: 12.8 g/dL (ref 12.0–15.0)
MCH: 22.8 pg — ABNORMAL LOW (ref 26.0–34.0)
MCHC: 30.5 g/dL (ref 30.0–36.0)
MCV: 74.7 fL — ABNORMAL LOW (ref 80.0–100.0)
Platelets: 297 10*3/uL (ref 150–400)
RBC: 5.61 MIL/uL — ABNORMAL HIGH (ref 3.87–5.11)
RDW: 13.2 % (ref 11.5–15.5)
WBC: 7.3 10*3/uL (ref 4.0–10.5)
nRBC: 0 % (ref 0.0–0.2)

## 2020-01-31 LAB — TROPONIN I (HIGH SENSITIVITY)
Troponin I (High Sensitivity): 4 ng/L (ref <18)
Troponin I (High Sensitivity): <2 ng/L (ref <18)

## 2020-01-31 LAB — I-STAT BETA HCG BLOOD, ED (MC, WL, AP ONLY): I-stat hCG, quantitative: <5 m[IU]/mL (ref <5)

## 2020-01-31 MED ORDER — ONDANSETRON HCL 4 MG/2ML IJ SOLN
4.0000 mg | Freq: Once | INTRAMUSCULAR | Status: AC
  Administered 2020-01-31: 17:00:00 4 mg via INTRAVENOUS
  Filled 2020-01-31: qty 2

## 2020-01-31 MED ORDER — FENTANYL CITRATE (PF) 100 MCG/2ML IJ SOLN
75.0000 ug | Freq: Once | INTRAMUSCULAR | Status: AC
  Administered 2020-01-31: 17:00:00 75 ug via INTRAVENOUS
  Filled 2020-01-31: qty 2

## 2020-01-31 MED ORDER — IOHEXOL 350 MG/ML SOLN
75.0000 mL | Freq: Once | INTRAVENOUS | Status: AC | PRN
  Administered 2020-01-31: 18:00:00 75 mL via INTRAVENOUS

## 2020-01-31 NOTE — ED Triage Notes (Signed)
Pt arrives POV for eval of chest pain that she noticed after waking this morning. Pt also reports L sided arm weakness and tingling and vision changes to L eye as well. Pt reports neuro changes onset 1230.

## 2020-01-31 NOTE — Progress Notes (Signed)
MD Lynelle Doctor to bedside to assess in triage, no additional orders to the triage order set

## 2020-01-31 NOTE — ED Provider Notes (Signed)
[facility] EMERGENCY DEPARTMENT Provider Note   CSN: [id number] Arrival date & time: 01/31/20  1405     History Chief Complaint  Patient presents with  . Chest Pain    Diana [name] is a 34 y.o. female.  HPI  HPI: A 34 year old patient with a history of obesity presents for evaluation of chest pain. Initial onset of pain was approximately 1-3 hours ago. The patient's chest pain is not worse with exertion. The patient complains of nausea. The patient's chest pain is middle- or left-sided, is not well-localized, is not described as heaviness/pressure/tightness, is not sharp and does radiate to the arms/jaw/neck. The patient denies diaphoresis. The patient has no history of stroke, has no history of peripheral artery disease, has not smoked in the past 90 days, denies any history of treated diabetes, has no relevant family history of coronary artery disease (first degree relative at less than age 21), is not hypertensive and has no history of hypercholesterolemia. Patient presented to the emergency room for evaluation of sharp chest pain.  Patient states that symptoms started this morning.  She developed sharp pain in the center of her chest.  It seems to radiate towards her back.  Patient then started having some tingling in the left side of her arm as well as a weak sensation in her left arm.  Patient denies any history of PVD or DVT.  No history of cardiac disease.    The pain does increase with deep breathing and certain movements.    Past Medical History:  Diagnosis Date  . Medical history non-contributory     Patient Active Problem List   Diagnosis Date Noted  . Postpartum care following vaginal delivery 08/18/2015  . Oligohydramnios in third trimester, antepartum 08/17/2015  . Labor and delivery indication for care or intervention 08/17/2015  . Oligohydramnios, antepartum 08/17/2015  . Gestational diabetes mellitus (GDM) affecting pregnancy 08/11/2015     Past Surgical History:  Procedure Laterality Date  . NO PAST SURGERIES       OB History    Gravida  4   Para  3   Term  3   Preterm  0   AB  1   Living  3     SAB      TAB      Ectopic      Multiple  0   Live Births  3           Family History  Problem Relation Age of Onset  . Diabetes Paternal Grandmother   . Hypertension Paternal Grandmother     Social History   Tobacco Use  . Smoking status: Never Smoker  . Smokeless tobacco: Never Used  Substance Use Topics  . Alcohol use: No  . Drug use: No    Home Medications Prior to Admission medications   Medication Sig Start Date End Date Taking? Authorizing Provider  ferrous sulfate (FERROUSUL) 325 (65 FE) MG tablet Take 1 tablet (325 mg total) by mouth daily. Patient not taking: Reported on 12/16/2016 08/20/15   Vena Austria, MD  ibuprofen (ADVIL,MOTRIN) 600 MG tablet Take 1 tablet (600 mg total) by mouth every 6 (six) hours. Patient taking differently: Take 600 mg by mouth every 6 (six) hours as needed.  08/20/15   Vena Austria, MD  Prenatal Vit-Fe Fumarate-FA (MULTIVITAMIN-PRENATAL) 27-0.8 MG TABS tablet Take 1 tablet by mouth daily at 12 noon.    [provider]    Allergies    Morphine  and related  Review of Systems   Review of Systems  All other systems reviewed and are negative.   Physical Exam Updated Vital Signs BP (!) 166/121 (BP Location: Right Arm)   Pulse 97   Temp 98.7 F (37.1 C) (Oral)   Resp 20   Ht 1.727 m (5\' 8" )   Wt 91 kg   SpO2 100%   BMI 30.50 kg/m   Physical Exam Vitals and nursing note reviewed.  Constitutional:      General: She is not in acute distress.    Appearance: She is well-developed.  HENT:     Head: Normocephalic and atraumatic.     Right Ear: External ear normal.     Left Ear: External ear normal.  Eyes:     General: No scleral icterus.       Right eye: No discharge.        Left eye: No discharge.     Conjunctiva/sclera:  Conjunctivae normal.  Neck:     Trachea: No tracheal deviation.  Cardiovascular:     Rate and Rhythm: Normal rate and regular rhythm.  Pulmonary:     Effort: Pulmonary effort is normal. No respiratory distress.     Breath sounds: Normal breath sounds. No stridor. No wheezing or rales.  Abdominal:     General: Bowel sounds are normal. There is no distension.     Palpations: Abdomen is soft.     Tenderness: There is no abdominal tenderness. There is no guarding or rebound.  Musculoskeletal:        General: No tenderness.     Cervical back: Neck supple.  Skin:    General: Skin is warm and dry.     Findings: No rash.  Neurological:     Mental Status: She is alert and oriented to person, place, and time.     Cranial Nerves: No cranial nerve deficit (No facial droop, extraocular movements intact, tongue midline ).     Sensory: No sensory deficit.     Motor: No abnormal muscle tone or seizure activity.     Coordination: Coordination normal.     Comments: No pronator drift bilateral upper extrem, able to hold both legs off bed for 5 seconds, sensation intact in all extremities, no visual field cuts, no left or right sided neglect, normal finger-nose exam bilaterally, no nystagmus noted      ED Results / Procedures / Treatments   Labs (all labs ordered are listed, but only abnormal results are displayed) Labs Reviewed  BASIC METABOLIC PANEL - Abnormal; Notable for the following components:      Result Value   Glucose, Bld 142 (*)    All other components within normal limits  CBC  I-STAT BETA HCG BLOOD, ED ([facility], [facility], [facility] ONLY)  TROPONIN I (HIGH SENSITIVITY)  TROPONIN I (HIGH SENSITIVITY)    EKG EKG Interpretation  Date/Time:  Monday January 31 2020 14:13:13 EDT Ventricular Rate:  90 PR Interval:  148 QRS Duration: 76 QT Interval:  374 QTC Calculation: 457 R Axis:   48 Text Interpretation: Normal sinus rhythm Normal ECG No old tracing to compare Confirmed by 03-16-2004 [phone]) on  01/31/2020 3:56:55 PM   Radiology DG Chest 2 View  Result Date: 01/31/2020 CLINICAL DATA:  Pt c/o chest pain x 1 day. No hx of heart or lung problems. Pt is a nonsmoker. EXAM: CHEST - 2 VIEW COMPARISON:  Chest radiograph 09/04/2010 FINDINGS: The cardiomediastinal contours are within normal limits. The lungs are clear.  No pneumothorax or pleural effusion. No acute finding in the visualized skeleton. IMPRESSION: No active cardiopulmonary disease. Electronically Signed   By: Emmaline Kluver M.D.   On: 01/31/2020 15:01    Procedures Procedures (including critical care time)  Medications Ordered in ED Medications  fentaNYL (SUBLIMAZE) injection 75 mcg (has no administration in time range)  ondansetron (ZOFRAN) injection 4 mg (has no administration in time range)    ED Course  I have reviewed the triage vital signs and the nursing notes.  Pertinent labs & imaging results that were available during my care of the patient were reviewed by me and considered in my medical decision making (see chart for details).  Clinical Course as of Jan 30 1601  Mon Jan 31, 2020  1558 Electrolyte panel normal.  Initial troponin normal.   [JK]  1558 Patient noted to be hypertensive.   [JK]    Clinical Course User Index [JK] Linwood Dibbles, MD   MDM Rules/Calculators/A&P HEAR Score: 2             NIH Stroke Scale: 0           Pt presents with chest pain.   DDx includes, ACS, PE, Aortic dissection, HTN urgency, pleurisy.  Initial trop normal.  Low risk heart score.  Will check delta trop.  With her symptoms will ct to evaluate for pe, and evaluate aorta.    Care turned over to oncoming MD Final Clinical Impression(s) / ED Diagnoses Final diagnoses:  Chest pain, unspecified type      Linwood Dibbles, MD 01/31/20 [id number]

## 2020-01-31 NOTE — ED Notes (Signed)
Patient verbalizes understanding of discharge instructions. Opportunity for questioning and answers were provided. Armband removed by staff, pt discharged from ED to home 

## 2020-01-31 NOTE — Discharge Instructions (Addendum)
You were seen in the emergency department for evaluation of sharp chest pain.  You had blood work EKG and a CAT scan of your chest that did not show any serious findings.  Please schedule follow-up with primary care.  Return to the emergency department for any worsening or concerning symptoms.

## 2020-01-31 NOTE — ED Provider Notes (Signed)
Signout from Dr. Lynelle Doctor.  34 year old female here with sharp substernal chest pain.  No prior history of cardiac disease.  She is getting delta troponin and a CT PE.  Otherwise low risk chest pain.  Anticipate can be discharged if work-up is negative. Physical Exam  BP (!) 166/121 (BP Location: Right Arm)   Pulse 97   Temp 98.7 F (37.1 C) (Oral)   Resp 20   Ht 5\' 8"  (1.727 m)   Wt 91 kg   SpO2 100%   BMI 30.50 kg/m   Physical Exam  ED Course/Procedures   Clinical Course as of Jan 30 1645  Mon Jan 31, 2020  1558 Electrolyte panel normal.  Initial troponin normal.   [JK]  1558 Patient noted to be hypertensive.   [JK]    Clinical Course User Index [JK] Feb 02, 2020, MD    Procedures  MDM  Patient's delta troponin is unchanged and her CT PE does not show any acute findings.  Reviewed all this with her and she is comfortable with discharge.  Does not have a PCP but does have insurance so recommended she work on trying to get scheduled with somebody.  Return instructions discussed.      Linwood Dibbles, MD 02/01/20 1113

## 2021-03-27 ENCOUNTER — Emergency Department (HOSPITAL_COMMUNITY)
Admission: EM | Admit: 2021-03-27 | Discharge: 2021-03-27 | Disposition: A | Discharge status: Home / Self Care | Payer: BC Managed Care – PPO | Attending: Emergency Medicine | Admitting: Emergency Medicine

## 2021-03-27 ENCOUNTER — Emergency Department (HOSPITAL_COMMUNITY): Payer: BC Managed Care – PPO

## 2021-03-27 ENCOUNTER — Other Ambulatory Visit: Payer: Self-pay

## 2021-03-27 ENCOUNTER — Encounter (HOSPITAL_COMMUNITY): Payer: Self-pay | Admitting: Emergency Medicine

## 2021-03-27 DIAGNOSIS — N939 Abnormal uterine and vaginal bleeding, unspecified: Secondary | ICD-10-CM | POA: Insufficient documentation

## 2021-03-27 DIAGNOSIS — R1084 Generalized abdominal pain: Secondary | ICD-10-CM | POA: Diagnosis present

## 2021-03-27 DIAGNOSIS — R112 Nausea with vomiting, unspecified: Secondary | ICD-10-CM | POA: Insufficient documentation

## 2021-03-27 DIAGNOSIS — R102 Pelvic and perineal pain: Secondary | ICD-10-CM

## 2021-03-27 LAB — URINALYSIS, ROUTINE W REFLEX MICROSCOPIC
Bilirubin Urine: NEGATIVE
Glucose, UA: NEGATIVE mg/dL
Ketones, ur: NEGATIVE mg/dL
Leukocytes,Ua: NEGATIVE
Nitrite: NEGATIVE
Protein, ur: 30 mg/dL — AB
Specific Gravity, Urine: 1.026 (ref 1.005–1.030)
pH: 5 (ref 5.0–8.0)

## 2021-03-27 LAB — CBC
HCT: 43.5 % (ref 36.0–46.0)
Hemoglobin: 13.6 g/dL (ref 12.0–15.0)
MCH: 23.3 pg — ABNORMAL LOW (ref 26.0–34.0)
MCHC: 31.3 g/dL (ref 30.0–36.0)
MCV: 74.5 fL — ABNORMAL LOW (ref 80.0–100.0)
Platelets: 280 10*3/uL (ref 150–400)
RBC: 5.84 MIL/uL — ABNORMAL HIGH (ref 3.87–5.11)
RDW: 13.2 % (ref 11.5–15.5)
WBC: 18.3 10*3/uL — ABNORMAL HIGH (ref 4.0–10.5)
nRBC: 0 % (ref 0.0–0.2)

## 2021-03-27 LAB — COMPREHENSIVE METABOLIC PANEL
ALT: 27 U/L (ref 0–44)
AST: 22 U/L (ref 15–41)
Albumin: 4.1 g/dL (ref 3.5–5.0)
Alkaline Phosphatase: 67 U/L (ref 38–126)
Anion gap: 10 (ref 5–15)
BUN: 9 mg/dL (ref 6–20)
CO2: 24 mmol/L (ref 22–32)
Calcium: 9.2 mg/dL (ref 8.9–10.3)
Chloride: 102 mmol/L (ref 98–111)
Creatinine, Ser: 0.74 mg/dL (ref 0.44–1.00)
GFR, Estimated: >60 mL/min (ref >60)
Glucose, Bld: 186 mg/dL — ABNORMAL HIGH (ref 70–99)
Potassium: 3.9 mmol/L (ref 3.5–5.1)
Sodium: 136 mmol/L (ref 135–145)
Total Bilirubin: 0.9 mg/dL (ref 0.3–1.2)
Total Protein: 7.6 g/dL (ref 6.5–8.1)

## 2021-03-27 LAB — LIPASE, BLOOD: Lipase: 25 U/L (ref 11–51)

## 2021-03-27 LAB — I-STAT BETA HCG BLOOD, ED (MC, WL, AP ONLY): I-stat hCG, quantitative: <5 m[IU]/mL (ref <5)

## 2021-03-27 MED ORDER — OXYCODONE-ACETAMINOPHEN 5-325 MG PO TABS
1.0000 | ORAL_TABLET | Freq: Once | ORAL | Status: AC
  Administered 2021-03-27: 1 via ORAL
  Filled 2021-03-27: qty 1

## 2021-03-27 MED ORDER — ONDANSETRON 4 MG PO TBDP
4.0000 mg | ORAL_TABLET | Freq: Once | ORAL | Status: AC
  Administered 2021-03-27: 4 mg via ORAL
  Filled 2021-03-27: qty 1

## 2021-03-27 MED ORDER — KETOROLAC TROMETHAMINE 60 MG/2ML IM SOLN
30.0000 mg | Freq: Once | INTRAMUSCULAR | Status: AC
  Administered 2021-03-27: 30 mg via INTRAMUSCULAR
  Filled 2021-03-27: qty 2

## 2021-03-27 MED ORDER — HYDROCODONE-ACETAMINOPHEN 5-325 MG PO TABS: 1.0000 | ORAL_TABLET | Freq: Four times a day (QID) | ORAL | 0 refills | Status: DC | PRN

## 2021-03-27 MED ORDER — IBUPROFEN 600 MG PO TABS: 600.0000 mg | ORAL_TABLET | Freq: Four times a day (QID) | ORAL | 0 refills | Status: DC | PRN

## 2021-03-27 NOTE — ED Triage Notes (Signed)
Pt states she has been having sharp lower abd pain. Pt states she has an IUD and had a period last month. Pt started having light bleeding last night. Pt feels like something is going on with her IUD. Feels nauseous from the pain.

## 2021-03-27 NOTE — ED Provider Notes (Signed)
[facility] EMERGENCY DEPARTMENT Provider Note   CSN: [id number] Arrival date & time: 03/27/21  0840     History Chief Complaint  Patient presents with   Abdominal Pain    Diana [name] is a 35 y.o. female.  Pt is a 35 yo female presenting for abdominal pain. Pt admits to generalized abdominal pain, acute, severe, non radiating, and associated with nausea and vomiting. States similar symptoms occurred in June of this year and resolved after two days of bed rest. States she has the [place] intrauterine device-year 4 and has experienced new vaginal bleeding intermittently, spotting, since June. Denies dysuria, increased frequency, or urgency.   The history is provided by the patient. No language interpreter was used.  Abdominal Pain Associated symptoms: nausea, vaginal bleeding and vomiting   Associated symptoms: no chest pain, no chills, no cough, no dysuria, no fever, no hematuria, no shortness of breath and no sore throat       Past Medical History:  Diagnosis Date   Medical history non-contributory     Patient Active Problem List   Diagnosis Date Noted   Postpartum care following vaginal delivery 08/18/2015   Oligohydramnios in third trimester, antepartum 08/17/2015   Labor and delivery indication for care or intervention 08/17/2015   Oligohydramnios, antepartum 08/17/2015   Gestational diabetes mellitus (GDM) affecting pregnancy 08/11/2015    Past Surgical History:  Procedure Laterality Date   NO PAST SURGERIES       OB History     Gravida  4   Para  3   Term  3   Preterm  0   AB  1   Living  3      SAB      IAB      Ectopic      Multiple  0   Live Births  3           Family History  Problem Relation Age of Onset   Diabetes Paternal Grandmother    Hypertension Paternal Grandmother     Social History   Tobacco Use   Smoking status: Never   Smokeless tobacco: Never  Substance Use Topics   Alcohol use: No    Drug use: No    Home Medications Prior to Admission medications   Medication Sig Start Date End Date Taking? Authorizing Provider  ferrous sulfate (FERROUSUL) 325 (65 FE) MG tablet Take 1 tablet (325 mg total) by mouth daily. Patient not taking: Reported on 12/16/2016 08/20/15   Vena Austria, MD  ibuprofen (ADVIL,MOTRIN) 600 MG tablet Take 1 tablet (600 mg total) by mouth every 6 (six) hours. Patient taking differently: Take 600 mg by mouth every 6 (six) hours as needed.  08/20/15   Vena Austria, MD  Prenatal Vit-Fe Fumarate-FA (MULTIVITAMIN-PRENATAL) 27-0.8 MG TABS tablet Take 1 tablet by mouth daily at 12 noon.    [provider]    Allergies    Morphine and related  Review of Systems   Review of Systems  Constitutional:  Negative for chills and fever.  HENT:  Negative for ear pain and sore throat.   Eyes:  Negative for pain and visual disturbance.  Respiratory:  Negative for cough and shortness of breath.   Cardiovascular:  Negative for chest pain and palpitations.  Gastrointestinal:  Positive for abdominal pain, nausea and vomiting.  Genitourinary:  Positive for vaginal bleeding. Negative for dysuria and hematuria.  Musculoskeletal:  Negative for arthralgias and back pain.  Skin:  Negative for  color change and rash.  Neurological:  Negative for seizures and syncope.  All other systems reviewed and are negative.  Physical Exam Updated Vital Signs BP (!) 130/101   Pulse 87   Temp 98.8 F (37.1 C) (Oral)   Resp 16   SpO2 100%   Physical Exam Vitals and nursing note reviewed.  Constitutional:      General: She is not in acute distress.    Appearance: She is well-developed.  HENT:     Head: Normocephalic and atraumatic.  Eyes:     Conjunctiva/sclera: Conjunctivae normal.  Cardiovascular:     Rate and Rhythm: Normal rate and regular rhythm.     Heart sounds: No murmur heard. Pulmonary:     Effort: Pulmonary effort is normal. No respiratory distress.      Breath sounds: Normal breath sounds.  Abdominal:     Palpations: Abdomen is soft.     Tenderness: There is generalized abdominal tenderness.  Musculoskeletal:     Cervical back: Neck supple.  Skin:    General: Skin is warm and dry.  Neurological:     Mental Status: She is alert.    ED Results / Procedures / Treatments   Labs (all labs ordered are listed, but only abnormal results are displayed) Labs Reviewed  COMPREHENSIVE METABOLIC PANEL - Abnormal; Notable for the following components:      Result Value   Glucose, Bld 186 (*)    All other components within normal limits  CBC - Abnormal; Notable for the following components:   WBC 18.3 (*)    RBC 5.84 (*)    MCV 74.5 (*)    MCH 23.3 (*)    All other components within normal limits  URINALYSIS, ROUTINE W REFLEX MICROSCOPIC - Abnormal; Notable for the following components:   APPearance HAZY (*)    Hgb urine dipstick MODERATE (*)    Protein, ur 30 (*)    Bacteria, UA FEW (*)    All other components within normal limits  LIPASE, BLOOD  I-STAT BETA HCG BLOOD, ED ([facility], [facility], [facility] ONLY)    EKG None  Radiology No results found.  Procedures Procedures   Medications Ordered in ED Medications  ketorolac (TORADOL) injection 30 mg (has no administration in time range)  oxyCODONE-acetaminophen (PERCOCET/ROXICET) 5-325 MG per tablet 1 tablet (1 tablet Oral Given 03/27/21 1702)  ondansetron (ZOFRAN-ODT) disintegrating tablet 4 mg (4 mg Oral Given 03/27/21 1702)    ED Course  I have reviewed the triage vital signs and the nursing notes.  Pertinent labs & imaging results that were available during my care of the patient were reviewed by me and considered in my medical decision making (see chart for details).    MDM Rules/Calculators/A&P                          7:14 PM 35 yo female presenting for abdominal pain. Pt is Aox3, no acute distress, afebrile, with stable vitals. Physical exam demonstrates soft abdomen, no rigidity  or guarding, tenderness to palpation in all quadrants.  Serum pregnancy negative. Stable liver profile, lipase, and renal function. Transvaginal US demonstrates 1. IUD within the endometrial cavity. 2. Age-appropriate pelvic ultrasound. Pt offered CT abdomen and declined. Offered pelvic exam and declined. Requesting DC at this time.  Patient in no distress and overall condition improved here in the ED. Detailed discussions were had with the patient regarding current findings, and need for close f/u with OBGYNThe patient  has been instructed to return immediately if the symptoms worsen in any way for re-evaluation. Patient verbalized understanding and is in agreement with current care plan. All questions answered prior to discharge.        Final Clinical Impression(s) / ED Diagnoses Final diagnoses:  Suprapubic abdominal pain    Rx / DC Orders ED Discharge Orders     None        Franne Forts, DO 03/28/21 0004

## 2022-05-11 IMAGING — CT CT ANGIO CHEST
2 of 6 series · 19 of 36 positions shown · IV contrast (omnipaque)
Comparison: Chest radiograph dated 01/31/2020.

CLINICAL DATA: 33-year-old female with shortness of breath.

EXAM:
CT ANGIOGRAPHY CHEST WITH CONTRAST
TECHNIQUE: Multidetector CT imaging of the chest was performed using the
standard protocol during bolus administration of intravenous
contrast. Multiplanar CT image reconstructions and MIPs were
obtained to evaluate the vascular anatomy.
CONTRAST:  75mL OMNIPAQUE IOHEXOL 350 MG/ML SOLN

[Series 7: pe thins · axial · 0.80mm/px · z∈[+1133,+1346]mm · 18 of 338 slices shown]
[im 17/338  lung]
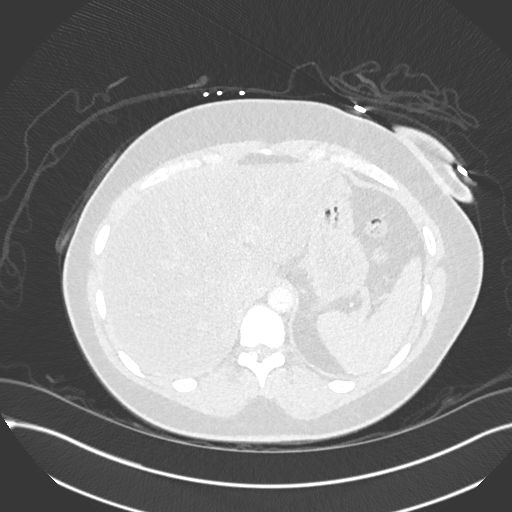
[im 34/338  mediastinal]
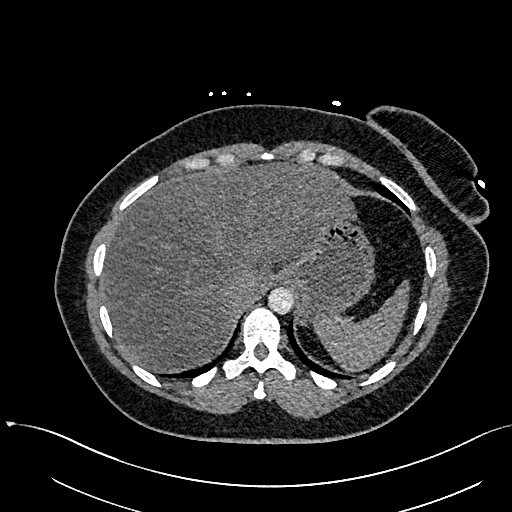
[im 51/338  lung]
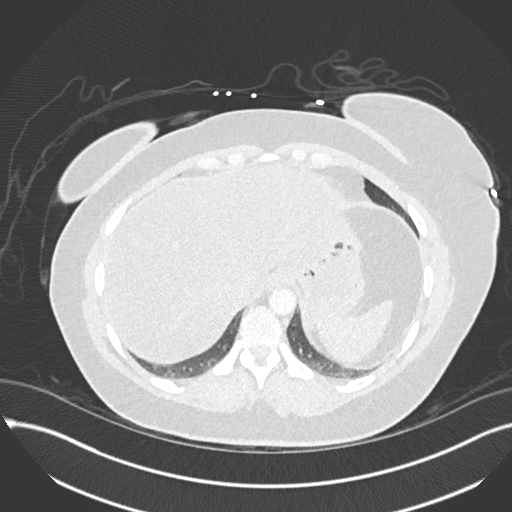
[im 68/338  mediastinal]
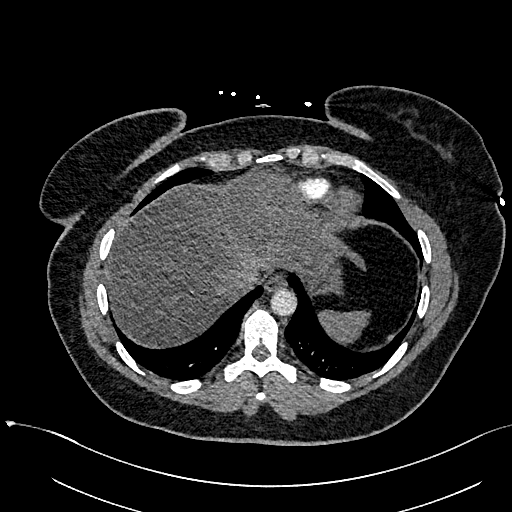
[im 85/338  lung]
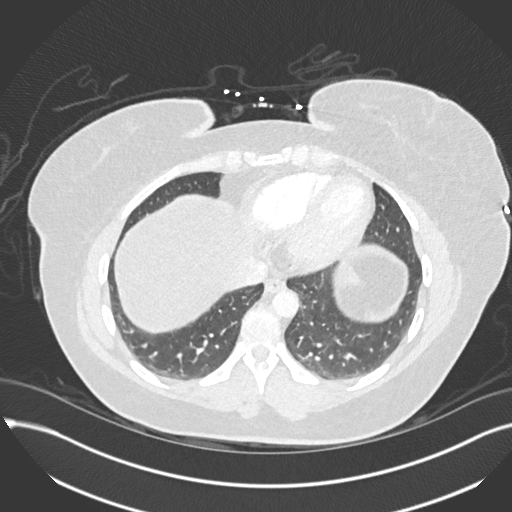
[im 102/338  mediastinal]
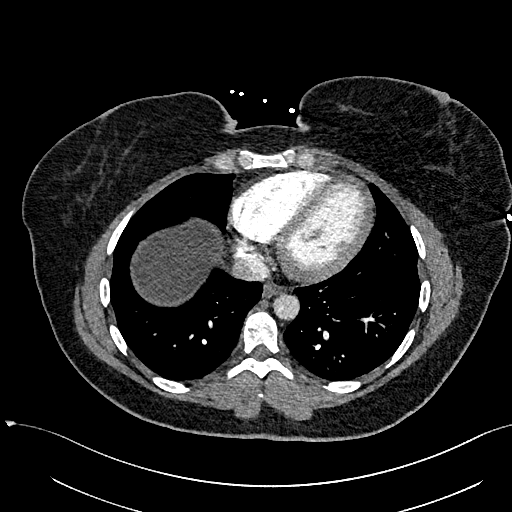
[im 118/338  lung]
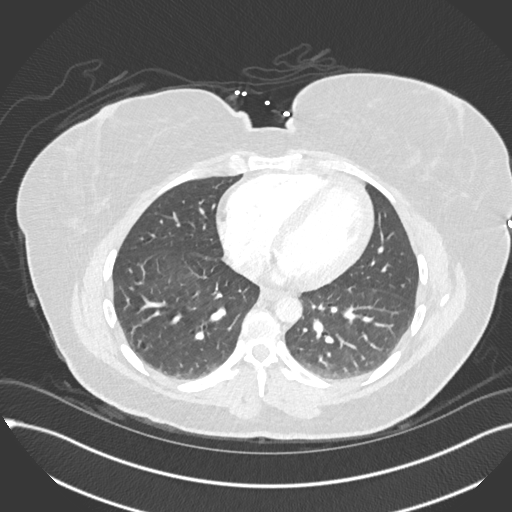
[im 135/338  mediastinal]
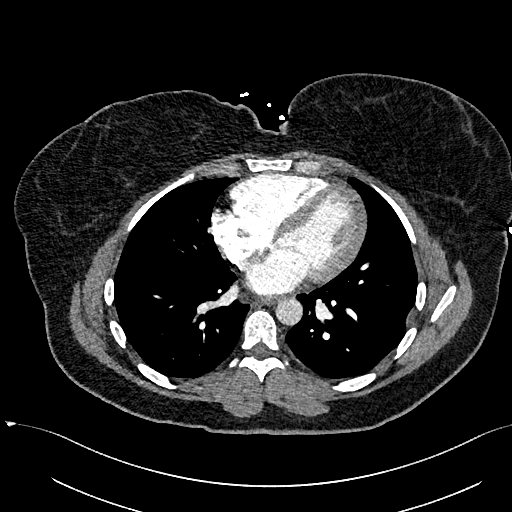
[im 152/338  lung]
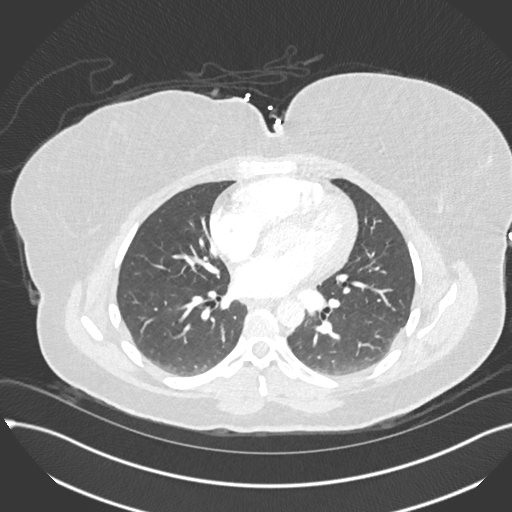
[im 186/338  mediastinal]
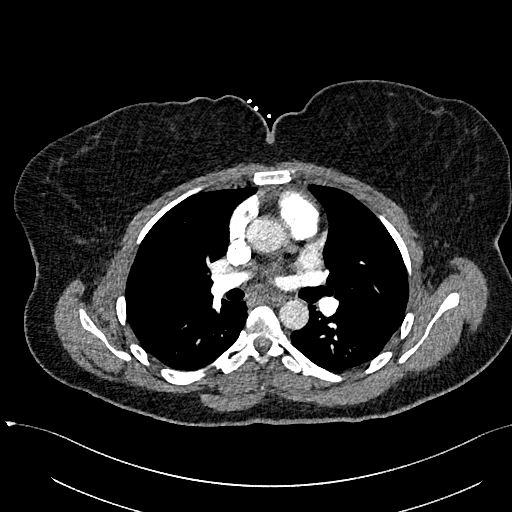
[im 203/338  lung]
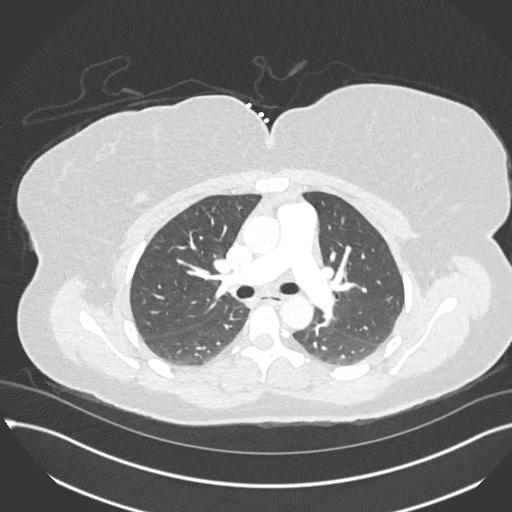
[im 220/338  mediastinal]
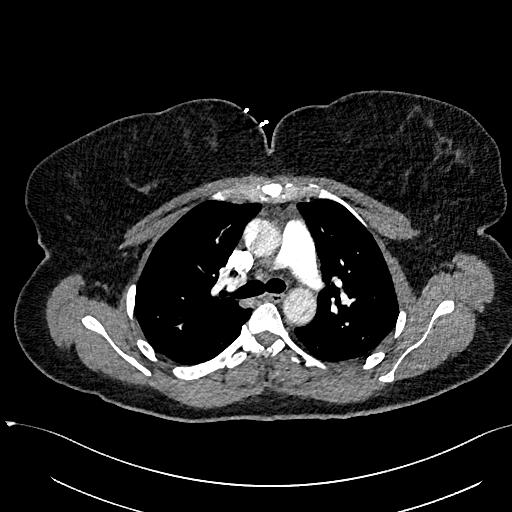
[im 236/338  lung]
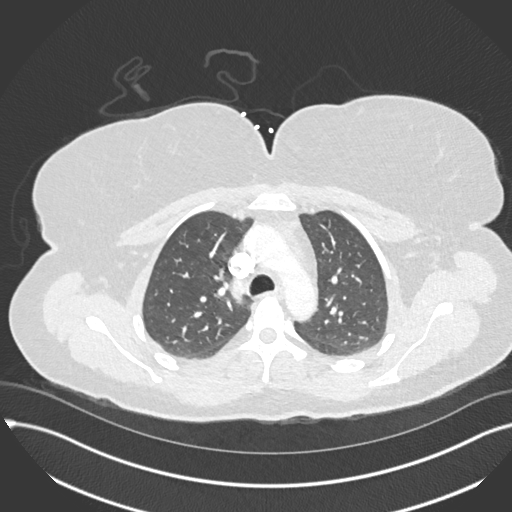
[im 253/338  mediastinal]
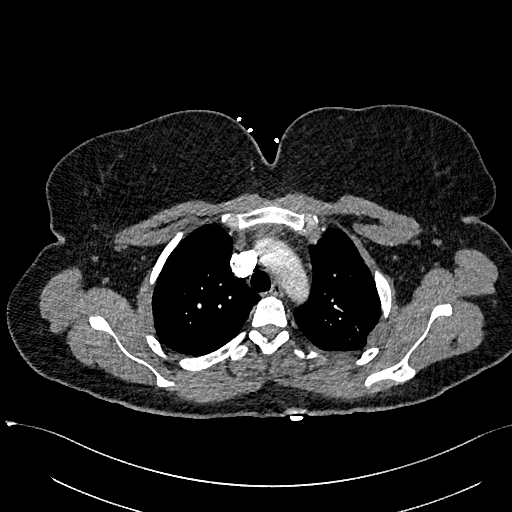
[im 270/338  lung]
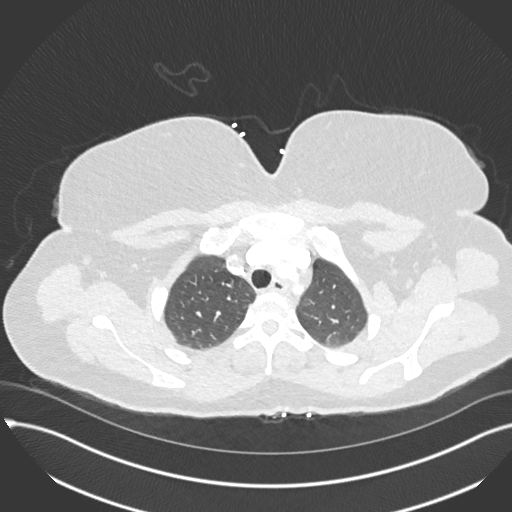
[im 287/338  mediastinal]
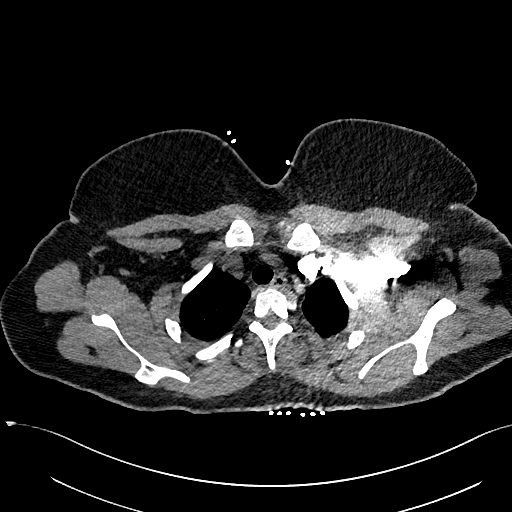
[im 304/338  lung]
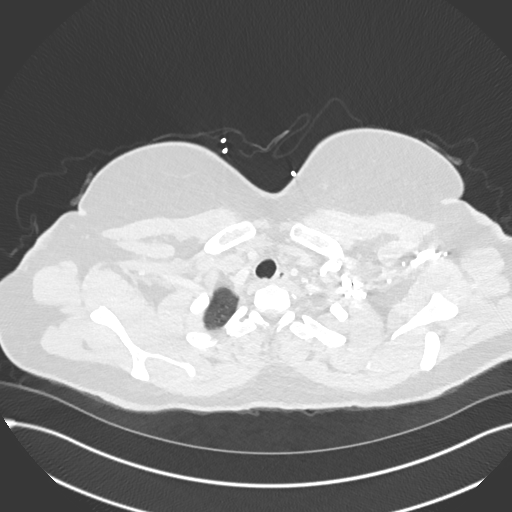
[im 321/338  mediastinal]
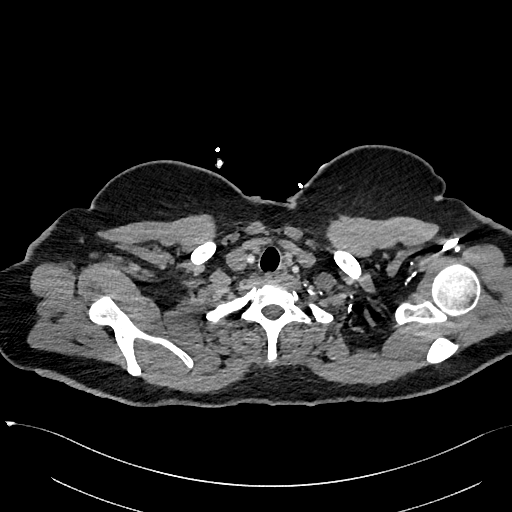

[Series 8: pe 2mm cor · coronal · 0.52mm/px · 1 of 151 slices shown]
[im 76/151  mediastinal]
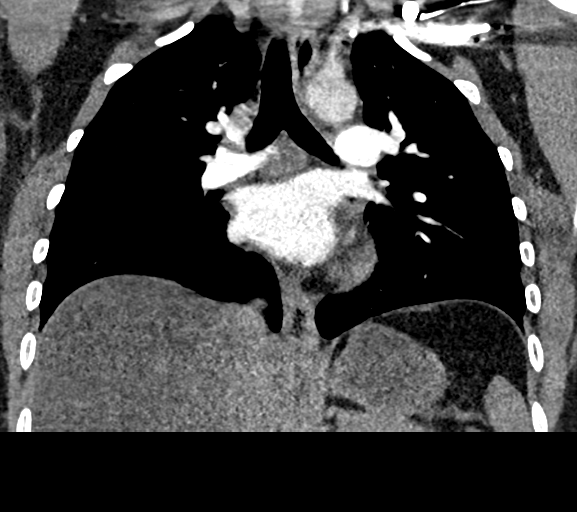

[19 of 36 positions shown; findings below may reference images not displayed]

FINDINGS: Cardiovascular: There is no cardiomegaly or pericardial effusion.
The thoracic aorta is unremarkable. No pulmonary artery embolus
identified.

Mediastinum/Nodes: There is no hilar or mediastinal adenopathy. The
esophagus is grossly unremarkable. No mediastinal fluid collection.

Lungs/Pleura: The lungs are clear. There is no pleural effusion
pneumothorax. The central airways are patent.

Upper Abdomen: Fatty liver.

Musculoskeletal: No chest wall abnormality. No acute or significant
osseous findings.

Review of the MIP images confirms the above findings.
IMPRESSION: 1. No acute intrathoracic pathology. No CT evidence of pulmonary
embolism.
2. Fatty liver.

## 2022-07-15 NOTE — L&D Delivery Note (Signed)
Delivery Note  First Stage: Labor onset: 1722 Augmentation : cytotec, pitocin, AROM of forebag Analgesia /Anesthesia intrapartum: epidural SROM at 1033, AROM Of forebag @ 1722  Second Stage: Complete dilation at 2150 Onset of pushing at 2200 FHR second stage Category II, recurrent variable decelerations, prolonged deceleration to the 60s immediately prior to delivery  Delivery of a viable female infant 03/18/23 at 2356 by Donato Schultz, CNM and Chari Manning, CNM. Very slow delivery of fetal head. Called for Dr. Feliberto Gottron to come to bedside for anticipated shoulder dystocia during delivery of the head. Fetal head delivered in OA position with no restitution. Katharina Caper assisted the head to restitute to [facility] and requested McRoberts and Suprapubic pressure. Small rotation of anterior shoulder with suprapubic pressure but unable to deliver anterior shoulder with gentle downward traction. Elby Showers reached for posterior arm which was behind the infant's back and gently maneuvered it anteriorly. A "pop" was felt and the posterior forearm was able to be easily swept across the infant's chest and the posterior arm delivered by D. Andrey Campanile, CNM. Katharina Caper attempted gentle downward traction again and the infant was delivered and placed on maternal abdomen. Loose nuchal cord x1 and body cord x1, reduced with delivery of the head;   Baby placed on mom's chest, cord double clamped and cut by F. Rubye Oaks, CNM, and infant taken to the warmer for evaluation by peds.  Cord blood sample collected   Arterial cord blood gases collected.  Dr. Feliberto Gottron arrived at bedside after delivery of the infant.  Third Stage: Placenta delivered manually intact with 3 VC @ 0023 by Dr. Feliberto Gottron. Placenta disposition: discarded Uterine tone firm with massage / bleeding brisk, slowed with cytotec rectally and 1g TXA IV.  4th degree laceration identified  Anesthesia for repair: IV fentanyl,  lidocaine Repair by Dr. Feliberto Gottron, see his note for more details. Est. Blood Loss (mL):  Complications: shoulder dystocia, 4th degree lacerations, postpartum hemorrhage, fetal macrosomia  Mom to postpartum.  Baby to NICU.  Newborn: Birth Weight: 4700g (10lb 6oz)  Apgar Scores: 1, 7 Feeding planned: breastfeeding and formula feeding.

## 2022-08-30 DIAGNOSIS — O09522 Supervision of elderly multigravida, second trimester: Secondary | ICD-10-CM | POA: Insufficient documentation

## 2022-09-30 LAB — OB RESULTS CONSOLE HEPATITIS B SURFACE ANTIGEN: Hepatitis B Surface Ag: NEGATIVE

## 2022-09-30 LAB — OB RESULTS CONSOLE RUBELLA ANTIBODY, IGM: Rubella: IMMUNE

## 2022-09-30 LAB — OB RESULTS CONSOLE GC/CHLAMYDIA
Chlamydia: NEGATIVE
Neisseria Gonorrhea: NEGATIVE

## 2022-09-30 LAB — OB RESULTS CONSOLE VARICELLA ZOSTER ANTIBODY, IGG: Varicella: IMMUNE

## 2022-10-02 ENCOUNTER — Other Ambulatory Visit: Payer: Self-pay | Admitting: Obstetrics

## 2022-10-02 DIAGNOSIS — Z3689 Encounter for other specified antenatal screening: Secondary | ICD-10-CM

## 2022-10-06 ENCOUNTER — Inpatient Hospital Stay (HOSPITAL_COMMUNITY)
Admission: AD | Admit: 2022-10-06 | Discharge: 2022-10-06 | Disposition: A | Discharge status: Home / Self Care | Payer: 59 | Attending: Obstetrics & Gynecology | Admitting: Obstetrics & Gynecology

## 2022-10-06 ENCOUNTER — Other Ambulatory Visit: Payer: Self-pay

## 2022-10-06 ENCOUNTER — Encounter (HOSPITAL_COMMUNITY): Payer: Self-pay | Admitting: *Deleted

## 2022-10-06 DIAGNOSIS — O26851 Spotting complicating pregnancy, first trimester: Secondary | ICD-10-CM | POA: Insufficient documentation

## 2022-10-06 DIAGNOSIS — Z3A14 14 weeks gestation of pregnancy: Secondary | ICD-10-CM | POA: Diagnosis not present

## 2022-10-06 LAB — URINALYSIS, ROUTINE W REFLEX MICROSCOPIC
Bacteria, UA: NONE SEEN
Bilirubin Urine: NEGATIVE
Glucose, UA: NEGATIVE mg/dL
Ketones, ur: NEGATIVE mg/dL
Leukocytes,Ua: NEGATIVE
Nitrite: NEGATIVE
Protein, ur: NEGATIVE mg/dL
Specific Gravity, Urine: 1.016 (ref 1.005–1.030)
pH: 6 (ref 5.0–8.0)

## 2022-10-06 NOTE — MAU Note (Signed)
Diana [name] is a 37 y.o. at Unknown here in MAU reporting: very light spotting and passing 2 clots (1 smaller than a dime and the other about the size of a dime).  Denies recent intercourse.  Denies pain LMP: NA Onset of complaint: today Pain score: 0 Vitals:   10/06/22 1245  BP: (!) 142/97  Pulse: 93  Resp: 20  Temp: 98.7 F (37.1 C)  SpO2: 100%     FHT:152 bpm Lab orders placed from triage:   UA

## 2022-10-06 NOTE — MAU Provider Note (Signed)
History     CSN: [id number]  Arrival date and time: 10/06/22 1209   Event Date/Time   First Provider Initiated Contact with Patient 10/06/22 1323      Chief Complaint  Patient presents with   Vaginal Bleeding    ARLAYNE [name] is a 37 y.o. [id number] at [redacted]w[redacted]d who receives care at [facility].  She presents today for spotting.  She states she passed 2 clots this morning. She states the first was about the size of a dime and the 2nd was smaller than a dime.  Patient denies cramping or discharge. No issues with constipation, diarrhea, or urination.  No recent sexual activity.  Patient reports that she is prescribed Nifedipine, but did not take it this morning.   OB History     Gravida  5   Para  3   Term  3   Preterm  0   AB  1   Living  3      SAB      IAB      Ectopic      Multiple  0   Live Births  3           Past Medical History:  Diagnosis Date   Medical history non-contributory     Past Surgical History:  Procedure Laterality Date   NO PAST SURGERIES      Family History  Problem Relation Age of Onset   Diabetes Paternal Grandmother    Hypertension Paternal Grandmother     Social History   Tobacco Use   Smoking status: Never   Smokeless tobacco: Never  Substance Use Topics   Alcohol use: No   Drug use: No    Allergies:  Allergies  Allergen Reactions   Morphine And Related Rash    Medications Prior to Admission  Medication Sig Dispense Refill Last Dose   ferrous sulfate (FERROUSUL) 325 (65 FE) MG tablet Take 1 tablet (325 mg total) by mouth daily. (Patient not taking: Reported on 12/16/2016) 30 tablet 1    HYDROcodone-acetaminophen (NORCO) 5-325 MG tablet Take 1 tablet by mouth every 6 (six) hours as needed for up to 12 doses for moderate pain. 12 tablet 0    ibuprofen (ADVIL) 600 MG tablet Take 1 tablet (600 mg total) by mouth every 6 (six) hours as needed. 30 tablet 0    Prenatal Vit-Fe Fumarate-FA (MULTIVITAMIN-PRENATAL)  27-0.8 MG TABS tablet Take 1 tablet by mouth daily at 12 noon.       Review of Systems  Gastrointestinal:  Negative for nausea and vomiting.  Genitourinary:  Positive for vaginal bleeding (Spotting). Negative for difficulty urinating, dysuria and vaginal discharge.   Physical Exam   Blood pressure (!) 142/97, pulse 93, temperature 98.7 F (37.1 C), temperature source Oral, resp. rate 20, height 5\' 8"  (1.727 m), weight 92.8 kg, SpO2 100 %, unknown if currently breastfeeding.  Physical Exam Vitals reviewed.  HENT:     Head: Normocephalic and atraumatic.  Eyes:     Conjunctiva/sclera: Conjunctivae normal.  Cardiovascular:     Rate and Rhythm: Normal rate.  Pulmonary:     Effort: Pulmonary effort is normal. No respiratory distress.  Musculoskeletal:        General: Normal range of motion.     Cervical back: Normal range of motion.  Skin:    General: Skin is warm and dry.  Neurological:     Mental Status: She is alert and oriented to person, place, and time.  Psychiatric:  Mood and Affect: Mood normal.        Behavior: Behavior normal.     MAU Course  Procedures Results for orders placed or performed during the hospital encounter of 10/06/22 (from the past 24 hour(s))  Urinalysis, Routine w reflex microscopic -Urine, Clean Catch     Status: Abnormal   Collection Time: 10/06/22  1:16 PM  Result Value Ref Range   Color, Urine YELLOW YELLOW   APPearance CLEAR CLEAR   Specific Gravity, Urine 1.016 1.005 - 1.030   pH 6.0 5.0 - 8.0   Glucose, UA NEGATIVE NEGATIVE mg/dL   Hgb urine dipstick MODERATE (A) NEGATIVE   Bilirubin Urine NEGATIVE NEGATIVE   Ketones, ur NEGATIVE NEGATIVE mg/dL   Protein, ur NEGATIVE NEGATIVE mg/dL   Nitrite NEGATIVE NEGATIVE   Leukocytes,Ua NEGATIVE NEGATIVE   RBC / HPF 0-5 0 - 5 RBC/hpf   WBC, UA 0-5 0 - 5 WBC/hpf   Bacteria, UA NONE SEEN NONE SEEN   Squamous Epithelial / HPF 0-5 0 - 5 /HPF   Mucus PRESENT     Patient informed that the  ultrasound is considered a limited OB ultrasound and is not intended to be a complete ultrasound exam.  Patient also informed that the ultrasound is not being completed with the intent of assessing for fetal or placental anomalies or any pelvic abnormalities.  Explained that the purpose of today's ultrasound is to assess for   reassurance and viability.  Patient acknowledges the purpose of the exam and the limitations of the study.  SIUP  c/w GA.  FHR of 140s. Good fetal movement noted.        MDM Labs: UA BSUS Assessment and Plan  37 year old, [id number]  SIUP at 78 weeks Spotting  -Reviewed POC with patient. -BSUS performed  -Discussed spotting in pregnancy. -PN records reviewed. Recent wet prep negative and GC/CT pending. -Precautions reviewed. -Instructed to take antihypertensives once she gets home.  -Discharged to home in stable condition.   Maryann Conners 10/06/2022, 1:23 PM

## 2022-10-07 DIAGNOSIS — O4692 Antepartum hemorrhage, unspecified, second trimester: Secondary | ICD-10-CM | POA: Insufficient documentation

## 2022-11-06 ENCOUNTER — Telehealth: Payer: 59

## 2022-11-06 ENCOUNTER — Ambulatory Visit: Payer: 59 | Attending: Maternal & Fetal Medicine

## 2022-11-06 ENCOUNTER — Other Ambulatory Visit: Payer: Self-pay

## 2022-11-06 ENCOUNTER — Ambulatory Visit (HOSPITAL_BASED_OUTPATIENT_CLINIC_OR_DEPARTMENT_OTHER): Payer: 59 | Admitting: Maternal & Fetal Medicine

## 2022-11-06 DIAGNOSIS — Z3689 Encounter for other specified antenatal screening: Secondary | ICD-10-CM

## 2022-11-06 DIAGNOSIS — Z7982 Long term (current) use of aspirin: Secondary | ICD-10-CM | POA: Insufficient documentation

## 2022-11-06 DIAGNOSIS — O24112 Pre-existing diabetes mellitus, type 2, in pregnancy, second trimester: Secondary | ICD-10-CM | POA: Diagnosis not present

## 2022-11-06 DIAGNOSIS — O10912 Unspecified pre-existing hypertension complicating pregnancy, second trimester: Secondary | ICD-10-CM

## 2022-11-06 DIAGNOSIS — O10012 Pre-existing essential hypertension complicating pregnancy, second trimester: Secondary | ICD-10-CM

## 2022-11-06 DIAGNOSIS — Z3A18 18 weeks gestation of pregnancy: Secondary | ICD-10-CM | POA: Diagnosis not present

## 2022-11-06 DIAGNOSIS — O09522 Supervision of elderly multigravida, second trimester: Secondary | ICD-10-CM | POA: Insufficient documentation

## 2022-11-06 DIAGNOSIS — Z363 Encounter for antenatal screening for malformations: Secondary | ICD-10-CM | POA: Insufficient documentation

## 2022-11-06 DIAGNOSIS — E119 Type 2 diabetes mellitus without complications: Secondary | ICD-10-CM

## 2022-11-06 DIAGNOSIS — Z7984 Long term (current) use of oral hypoglycemic drugs: Secondary | ICD-10-CM | POA: Insufficient documentation

## 2022-11-06 DIAGNOSIS — Z794 Long term (current) use of insulin: Secondary | ICD-10-CM

## 2022-11-06 DIAGNOSIS — Z79899 Other long term (current) drug therapy: Secondary | ICD-10-CM | POA: Diagnosis not present

## 2022-11-06 DIAGNOSIS — O24419 Gestational diabetes mellitus in pregnancy, unspecified control: Secondary | ICD-10-CM

## 2022-11-06 NOTE — Progress Notes (Signed)
MFM Consultation  Diana [name] is a 37 yo G4P3 who is seen aat 18w 3d with an EDD of 04/06/23. She is seen at the request of Chari Manning, CNM regarding the following issues:  1) AMA 37 yo  - She has a low risk NIPT and AFP. Desires no further testing  2) T2DM  -HgbA1c 8.3 and most recently 8.1% - Blood sugars are above goal due to nutrition. She has received diabetic counseling - She is taking metformin 500 mg BID and was recently prescribed insulin but  has not obtained from pharmacy yet, plans to do that today. -She is taking low dose ASA. - Ultrasound today is demonstrated normally grown fetus without markers of aneuploidy. - Fetal echocardiogram is scheduled in May  3) Chronic hypertension - Blood pressure 126/93 mmHg- repeat 124/88 mmHg - She is taking procardia 30 mg XL daily.  - Taking low dose ASA - Baseline labs are normal including UPC.   .    11/06/2022    9:09 AM 11/06/2022    8:04 AM 10/06/2022   12:45 PM  Vitals with BMI  Height     Weight  212 lbs   BMI  32.24   Systolic 124 126 409  Diastolic 88 93 97  Pulse  99 93   OB History  Gravida Para Term Preterm AB Living  0 0 3  SAB IAB Ectopic Multiple Live Births  0 0 0 0 3    # Outcome Date GA Lbr Len/2nd Weight Sex Delivery Anes PTL Lv  4 Current           3 Term 12/29/16 [redacted]w[redacted]d 07:02 / 02:20 3940 g M Vag-Spont EPI  LIV     Name: Diana [name], Diana [name]     Apgar5: 9  2 Term 08/18/15 [redacted]w[redacted]d / 02:18 3730 g F Vag-Spont EPI  LIV     Name: Diana [name]     Apgar1: 8  Apgar5: 9  1 Term     F    LIV   Past Medical History:  Diagnosis Date   Labor and delivery indication for care or intervention 08/17/2015   Medical history non-contributory    Oligohydramnios, antepartum 08/17/2015   Postpartum care following vaginal delivery 08/18/2015   Past Surgical History:  Procedure Laterality Date   NO PAST SURGERIES     Family History  Problem Relation Age of Onset   Diabetes Paternal  Grandmother    Hypertension Paternal Grandmother    Social History   Socioeconomic History   Marital status: Married    Spouse name: Maurine Minister   Number of children: 3   Years of education: Not on file   Highest education level: Bachelor's degree (e.g., BA, AB, BS)  Occupational History   Occupation: IT trainer: [employer]  Tobacco Use   Smoking status: Never   Smokeless tobacco: Never  Vaping Use   Vaping Use: Never used  Substance and Sexual Activity   Alcohol use: No   Drug use: No   Sexual activity: Yes    Birth control/protection: Injection  Other Topics Concern   Not on file  Social History Narrative   Not on file   Social Determinants of Health   Financial Resource Strain: Not on file  Food Insecurity: Not on file  Transportation Needs: Not on file  Physical Activity: Not on file  Stress: Not on file  Social Connections: Not on file  Intimate Partner Violence: Not on file  Current Outpatient Medications (Endocrine & Metabolic):    insulin glargine (LANTUS) 100 UNIT/ML injection, Inject 25 Units into the skin at bedtime. Lantus Solo Diana [name] U-100 Insulin Pen injector   insulin regular (NOVOLIN R) 100 units/mL injection, Inject 27 Units into the skin 2 (two) times daily before a meal. 15 units every morning with the first bite of food 12 every night   metFORMIN (GLUCOPHAGE-XR) 500 MG 24 hr tablet, Take 500 mg by mouth 2 (two) times daily with a meal.  Current Outpatient Medications (Cardiovascular):    NIFEdipine (PROCARDIA XL/NIFEDICAL-XL) 90 MG 24 hr tablet, Take 90 mg by mouth daily.   Current Outpatient Medications (Analgesics):    aspirin 81 MG chewable tablet, Chew 81 mg by mouth daily.   Current Outpatient Medications (Other):    Prenatal Vit-Fe Fumarate-FA (MULTIVITAMIN-PRENATAL) 27-0.8 MG TABS tablet, Take 1 tablet by mouth daily at 12 noon. Allergies  Allergen Reactions   Morphine And Related Rash    Imaging: Single intrauterine pregnancy here  for a detailed anatomy due to type 2 diabetes, CHTN, and AMA Normal anatomy with measurements consistent with dates There is good fetal movement and amniotic fluid volume Suboptimal views of the fetal anatomy were obtained secondary to fetal position.  EFW 32% - with no markers of aneuploidy.  Impression/Counseling:  AMA I discussed with Diana [name] that there are two primary risk for women > 97 yo, Aneuploidy and maternal complications of pregnancy. We discussed that non-invasive and invasive testing is available. We discussed the risk and benefits of both approaches. She has a low risk NIPT and is satisfied with that result in the context of a normal anatomy exam.   Secondly we discussed the increased risk for preeclampsia and fetal growth restriction. She is taking low dose ASA daily therefore she is addressing her risk in this area.  T2DM: Diana [name] has a 8% hgbA1c in entering pregnancy. She has received diabetic teaching. She is aware of the goals of FBS < 95, 2hr PP < 120 mg/mL. She is currently taking metformin and was recently added insulin. She reports that her primary challenge is disciplined nutrition and exercise habits. We discussed nutrition and exercise patterns that may benefit her. She plans to try them.  We discussed the complications associated with poorly controlled T2DM including fetal cardiac defects, stillbirth, miscarriage, fetal macrosomia, birth trauma, cesarean delivery, and neonatal admission secondary to hypoglycemia.  We discussed the mainstay of management includes serial growth exams, a fetal echocardiogram and initiate weekly testing at 32 weeks. Delivery between 37-39 weeks pending maternal/fetal status.   CHTN Diana [name] has chronic hypertension medically managed with Procardia XL. We discussed the goal of management to maintain BP 135/85 mmHg on average.   We discussed the increased risk for fetal growth restriction, preeclampsia, stillbirth, and placental  abruption in women with uncontrolled hypertension.  Given this she is using low dose ASA for preventative therapy. In addition, she will receive fetal surveillance regimen as described above.   All questions answered.  She was scheduled to return for fetal growth and anatomy in 4 weeks.   I spent 60 minutes in review of records, consultation and care coordination.  Novella Olive, MD

## 2022-12-02 ENCOUNTER — Other Ambulatory Visit: Payer: Self-pay

## 2022-12-02 DIAGNOSIS — O09522 Supervision of elderly multigravida, second trimester: Secondary | ICD-10-CM

## 2022-12-02 DIAGNOSIS — O10912 Unspecified pre-existing hypertension complicating pregnancy, second trimester: Secondary | ICD-10-CM

## 2022-12-02 DIAGNOSIS — O24419 Gestational diabetes mellitus in pregnancy, unspecified control: Secondary | ICD-10-CM

## 2022-12-04 ENCOUNTER — Ambulatory Visit: Payer: 59 | Attending: Maternal & Fetal Medicine

## 2022-12-04 ENCOUNTER — Other Ambulatory Visit: Payer: Self-pay

## 2022-12-04 DIAGNOSIS — Z7984 Long term (current) use of oral hypoglycemic drugs: Secondary | ICD-10-CM

## 2022-12-04 DIAGNOSIS — E119 Type 2 diabetes mellitus without complications: Secondary | ICD-10-CM

## 2022-12-04 DIAGNOSIS — O24112 Pre-existing diabetes mellitus, type 2, in pregnancy, second trimester: Secondary | ICD-10-CM | POA: Diagnosis not present

## 2022-12-04 DIAGNOSIS — O10012 Pre-existing essential hypertension complicating pregnancy, second trimester: Secondary | ICD-10-CM | POA: Diagnosis not present

## 2022-12-04 DIAGNOSIS — O09522 Supervision of elderly multigravida, second trimester: Secondary | ICD-10-CM | POA: Insufficient documentation

## 2022-12-04 DIAGNOSIS — Z3A22 22 weeks gestation of pregnancy: Secondary | ICD-10-CM | POA: Diagnosis not present

## 2022-12-04 DIAGNOSIS — O321XX0 Maternal care for breech presentation, not applicable or unspecified: Secondary | ICD-10-CM | POA: Diagnosis present

## 2022-12-04 DIAGNOSIS — O10912 Unspecified pre-existing hypertension complicating pregnancy, second trimester: Secondary | ICD-10-CM

## 2022-12-04 DIAGNOSIS — O24419 Gestational diabetes mellitus in pregnancy, unspecified control: Secondary | ICD-10-CM

## 2022-12-04 DIAGNOSIS — Z794 Long term (current) use of insulin: Secondary | ICD-10-CM

## 2022-12-10 ENCOUNTER — Encounter (HOSPITAL_COMMUNITY): Payer: Self-pay | Admitting: Maternal & Fetal Medicine

## 2023-01-01 ENCOUNTER — Ambulatory Visit: Payer: 59

## 2023-01-02 DIAGNOSIS — O10919 Unspecified pre-existing hypertension complicating pregnancy, unspecified trimester: Secondary | ICD-10-CM | POA: Insufficient documentation

## 2023-01-02 DIAGNOSIS — O24112 Pre-existing diabetes mellitus, type 2, in pregnancy, second trimester: Secondary | ICD-10-CM | POA: Insufficient documentation

## 2023-01-06 ENCOUNTER — Other Ambulatory Visit: Payer: Self-pay

## 2023-01-06 ENCOUNTER — Ambulatory Visit: Payer: 59 | Attending: Maternal & Fetal Medicine

## 2023-01-06 ENCOUNTER — Other Ambulatory Visit: Payer: Self-pay | Admitting: Maternal & Fetal Medicine

## 2023-01-06 DIAGNOSIS — O10012 Pre-existing essential hypertension complicating pregnancy, second trimester: Secondary | ICD-10-CM

## 2023-01-06 DIAGNOSIS — E119 Type 2 diabetes mellitus without complications: Secondary | ICD-10-CM | POA: Diagnosis not present

## 2023-01-06 DIAGNOSIS — Z362 Encounter for other antenatal screening follow-up: Secondary | ICD-10-CM | POA: Diagnosis not present

## 2023-01-06 DIAGNOSIS — O24112 Pre-existing diabetes mellitus, type 2, in pregnancy, second trimester: Secondary | ICD-10-CM | POA: Diagnosis not present

## 2023-01-06 DIAGNOSIS — Z3A27 27 weeks gestation of pregnancy: Secondary | ICD-10-CM

## 2023-01-06 DIAGNOSIS — O09522 Supervision of elderly multigravida, second trimester: Secondary | ICD-10-CM

## 2023-01-06 DIAGNOSIS — Z794 Long term (current) use of insulin: Secondary | ICD-10-CM

## 2023-01-06 DIAGNOSIS — Z7984 Long term (current) use of oral hypoglycemic drugs: Secondary | ICD-10-CM

## 2023-02-07 ENCOUNTER — Encounter: Payer: Self-pay | Admitting: Maternal & Fetal Medicine

## 2023-02-09 ENCOUNTER — Other Ambulatory Visit: Payer: Self-pay

## 2023-02-09 DIAGNOSIS — O09522 Supervision of elderly multigravida, second trimester: Secondary | ICD-10-CM

## 2023-02-09 DIAGNOSIS — O24419 Gestational diabetes mellitus in pregnancy, unspecified control: Secondary | ICD-10-CM

## 2023-02-09 DIAGNOSIS — O10912 Unspecified pre-existing hypertension complicating pregnancy, second trimester: Secondary | ICD-10-CM

## 2023-02-10 ENCOUNTER — Ambulatory Visit: Payer: 59 | Admitting: Maternal & Fetal Medicine

## 2023-02-10 ENCOUNTER — Other Ambulatory Visit: Payer: Self-pay

## 2023-02-10 ENCOUNTER — Other Ambulatory Visit: Payer: Self-pay | Admitting: Obstetrics

## 2023-02-10 ENCOUNTER — Ambulatory Visit: Payer: 59 | Attending: Maternal & Fetal Medicine

## 2023-02-10 DIAGNOSIS — O10013 Pre-existing essential hypertension complicating pregnancy, third trimester: Secondary | ICD-10-CM | POA: Diagnosis not present

## 2023-02-10 DIAGNOSIS — O10912 Unspecified pre-existing hypertension complicating pregnancy, second trimester: Secondary | ICD-10-CM

## 2023-02-10 DIAGNOSIS — O24112 Pre-existing diabetes mellitus, type 2, in pregnancy, second trimester: Secondary | ICD-10-CM

## 2023-02-10 DIAGNOSIS — O10913 Unspecified pre-existing hypertension complicating pregnancy, third trimester: Secondary | ICD-10-CM | POA: Diagnosis not present

## 2023-02-10 DIAGNOSIS — O24113 Pre-existing diabetes mellitus, type 2, in pregnancy, third trimester: Secondary | ICD-10-CM | POA: Diagnosis present

## 2023-02-10 DIAGNOSIS — O09522 Supervision of elderly multigravida, second trimester: Secondary | ICD-10-CM

## 2023-02-10 DIAGNOSIS — Z794 Long term (current) use of insulin: Secondary | ICD-10-CM

## 2023-02-10 DIAGNOSIS — E1165 Type 2 diabetes mellitus with hyperglycemia: Secondary | ICD-10-CM

## 2023-02-10 DIAGNOSIS — O3663X0 Maternal care for excessive fetal growth, third trimester, not applicable or unspecified: Secondary | ICD-10-CM

## 2023-02-10 DIAGNOSIS — O09523 Supervision of elderly multigravida, third trimester: Secondary | ICD-10-CM | POA: Insufficient documentation

## 2023-02-10 DIAGNOSIS — Z3A32 32 weeks gestation of pregnancy: Secondary | ICD-10-CM | POA: Insufficient documentation

## 2023-02-10 DIAGNOSIS — Z7984 Long term (current) use of oral hypoglycemic drugs: Secondary | ICD-10-CM

## 2023-02-10 DIAGNOSIS — O24419 Gestational diabetes mellitus in pregnancy, unspecified control: Secondary | ICD-10-CM

## 2023-02-10 MED ORDER — INSULIN ASPART 100 UNIT/ML FLEXPEN: PEN_INJECTOR | SUBCUTANEOUS | 5 refills | Status: DC

## 2023-02-10 NOTE — Progress Notes (Signed)
   Patient information  Patient Name: Diana [name]  Patient MRN:   [id number]  Referring practice: [facility]FM Referring Provider: [facility]  MFM CONSULT  Diana [name] is a 37 y.o. [phone] at [redacted]w[redacted]d here for ultrasound and consultation.   RE DM2: I reviewed the patient's blood sugars and she continues to have hyperglycemia after most meals especially lunch.  She takes Lantus twice a day but only takes regular insulin with breakfast and dinner.  I discussed that her lunchtime values are likely elevated because she has no rapid acting insulin to cover her.  I discussed that typically NPH and regular insulin are combined and Lantus and aspart insulin are combined due to the duration of action of each medicine.  I discussed that to be beneficial to switch from regular insulin to aspart.  I will also add a lunchtime dose to cover her postprandial values.  Currently she is taking Lantus 53 units in the at night and 35 units in the morning which can remain the same.  She is aware of the complications associated with uncontrolled diabetes in pregnancy and knows that proper glycemic control can minimize these risks such as stillbirth.   Sonographic findings Single intrauterine pregnancy. Fetal cardiac activity: Observed. Presentation: Cephalic. Interval fetal anatomy appears normal.  Fetal biometry shows the estimated fetal weight at the 99 percentile and the abdominal circumference at the >99 percentile.  Amniotic fluid volume: Within normal limits. AFI: 21.98 cm.  MVP: 7.73 cm. Placenta: Posterior. BPP: 8/8.   Assessment -Type 2 diabetes in pregnancy Plan -Switch from regular insulin to rapid acting insulin and add a lunchtime dose.  I have prescribed this for her since she is almost out of her insulin. -Continue Lantus 53 units at night and 35 units in the morning -Continue weekly antenatal testing and serial growth ultrasounds -Delivery around 37 weeks due to LGA fetus in  the setting of type 2 diabetes  Review of Systems: A review of systems was performed and was negative except per HPI   Vitals and Physical Exam    02/10/2023    2:57 PM 12/04/2022    9:21 AM 11/06/2022    9:09 AM  Vitals with BMI  Height 5\' 8"  5\' 9"    Weight 216 lbs 216 lbs 8 oz   BMI 32.85 31.96   Systolic 125 119 295  Diastolic 82 82 88  Pulse 90 107   Sitting comfortably on the sonogram table Nonlabored breathing Normal rate and rhythm Abdomen is nontender  Past pregnancies OB History  Gravida Para Term Preterm AB Living  4 3 3  0 0 3  SAB IAB Ectopic Multiple Live Births        0 3    # Outcome Date GA Lbr Len/2nd Weight Sex Type Anes PTL Lv  4 Current           3 Term 12/29/16 [redacted]w[redacted]d 07:02 / 02:20 3940 g M Vag-Spont EPI  LIV  2 Term 08/18/15 [redacted]w[redacted]d / 02:18 3730 g F Vag-Spont EPI  LIV  1 Term     F    LIV    I spent 30 minutes reviewing the patients chart, including labs and images as well as counseling the patient about her medical conditions. Greater than 50% of the time was spent in direct face-to-face patient counseling.  Braxton Feathers  MFM, [place]   02/10/2023  4:20 PM

## 2023-02-14 ENCOUNTER — Other Ambulatory Visit: Payer: Self-pay | Admitting: Obstetrics

## 2023-02-14 DIAGNOSIS — Z3490 Encounter for supervision of normal pregnancy, unspecified, unspecified trimester: Secondary | ICD-10-CM

## 2023-02-14 NOTE — Progress Notes (Signed)
[id number] at [redacted]w[redacted]d, LMP of 06/30/22, c/w early [place] at [redacted]w[redacted]d.  Scheduled for Diabetes and Hypertension induction of labor on 03/19/23 @ 0800.   Prenatal provider: [facility] Pregnancy complicated by: Diabetes Type 2 CHTN AMA obesity  Prenatal Labs: Blood type/Rh O POS  Antibody screen neg  Rubella Immund    Varicella Immune  RPR NR  HBsAg   NR  Hep C NR  HIV   NR  GC neg  Chlamydia neg  Genetic screening cfDNA negative  1 hour GTT N/a  3 hour GTT N/a  GBS   pending   Tdap: given prenatally Flu: not in season Contraception: Tubal Ligation Feeding preference: breast feeding  ____ Diana [name], CNM Certified Nurse Midwife [facility]

## 2023-02-17 ENCOUNTER — Ambulatory Visit: Payer: 59 | Attending: Maternal & Fetal Medicine | Admitting: Maternal & Fetal Medicine

## 2023-02-17 ENCOUNTER — Other Ambulatory Visit: Payer: Self-pay

## 2023-02-17 DIAGNOSIS — Z3A33 33 weeks gestation of pregnancy: Secondary | ICD-10-CM

## 2023-02-17 DIAGNOSIS — O24419 Gestational diabetes mellitus in pregnancy, unspecified control: Secondary | ICD-10-CM

## 2023-02-17 DIAGNOSIS — O24112 Pre-existing diabetes mellitus, type 2, in pregnancy, second trimester: Secondary | ICD-10-CM

## 2023-02-17 DIAGNOSIS — O10919 Unspecified pre-existing hypertension complicating pregnancy, unspecified trimester: Secondary | ICD-10-CM

## 2023-02-17 DIAGNOSIS — O10912 Unspecified pre-existing hypertension complicating pregnancy, second trimester: Secondary | ICD-10-CM

## 2023-02-17 DIAGNOSIS — O09522 Supervision of elderly multigravida, second trimester: Secondary | ICD-10-CM

## 2023-02-17 NOTE — Procedures (Signed)
Diana [name] 1985-11-26 [redacted]w[redacted]d  Fetus A Non-Stress Test Interpretation for 02/17/23  Indication: CHTN, Gestational DM2, AMA  Fetal Heart Rate A Mode: External Baseline Rate (A): 140 bpm Variability: Moderate Accelerations: 15 x 15 Decelerations: None Multiple birth?: No  Uterine Activity Mode: Toco  Interpretation (Fetal Testing) Nonstress Test Interpretation: Reactive (Dr. Darra Lis interpreted NST) Overall Impression: Reassuring for gestational age

## 2023-02-24 ENCOUNTER — Other Ambulatory Visit: Payer: Self-pay

## 2023-02-24 ENCOUNTER — Ambulatory Visit: Payer: 59 | Attending: Obstetrics and Gynecology | Admitting: Obstetrics and Gynecology

## 2023-02-24 VITALS — BP 118/72 | HR 118 | Temp 97.0°F | Resp 19 | Ht 68.0 in | Wt 216.0 lb

## 2023-02-24 DIAGNOSIS — E119 Type 2 diabetes mellitus without complications: Secondary | ICD-10-CM | POA: Diagnosis not present

## 2023-02-24 DIAGNOSIS — O10013 Pre-existing essential hypertension complicating pregnancy, third trimester: Secondary | ICD-10-CM | POA: Diagnosis not present

## 2023-02-24 DIAGNOSIS — O09522 Supervision of elderly multigravida, second trimester: Secondary | ICD-10-CM

## 2023-02-24 DIAGNOSIS — O24112 Pre-existing diabetes mellitus, type 2, in pregnancy, second trimester: Secondary | ICD-10-CM

## 2023-02-24 DIAGNOSIS — O24419 Gestational diabetes mellitus in pregnancy, unspecified control: Secondary | ICD-10-CM

## 2023-02-24 DIAGNOSIS — O24113 Pre-existing diabetes mellitus, type 2, in pregnancy, third trimester: Secondary | ICD-10-CM | POA: Diagnosis not present

## 2023-02-24 DIAGNOSIS — O10919 Unspecified pre-existing hypertension complicating pregnancy, unspecified trimester: Secondary | ICD-10-CM

## 2023-02-24 DIAGNOSIS — O4692 Antepartum hemorrhage, unspecified, second trimester: Secondary | ICD-10-CM

## 2023-02-24 DIAGNOSIS — Z3A34 34 weeks gestation of pregnancy: Secondary | ICD-10-CM | POA: Diagnosis not present

## 2023-02-24 NOTE — Procedures (Signed)
Diana [name] 12-05-1985 [redacted]w[redacted]d  Fetus A Non-Stress Test Interpretation for 02/24/23  Indication: Chronic Hypertenstion, Diabetes, and Gestational Diabetes medication controlled  Fetal Heart Rate A Mode: External Baseline Rate (A): 135 bpm Variability: Moderate Accelerations: 15 x 15 Decelerations: None  Uterine Activity Mode: Toco Contraction Frequency (min): irritability Resting Tone Palpated: Relaxed Resting Time: Adequate  Interpretation (Fetal Testing) Nonstress Test Interpretation: Reactive Overall Impression: Reassuring for gestational age

## 2023-02-26 ENCOUNTER — Other Ambulatory Visit: Payer: Self-pay

## 2023-03-03 ENCOUNTER — Ambulatory Visit: Payer: 59 | Attending: Obstetrics

## 2023-03-12 ENCOUNTER — Ambulatory Visit: Payer: 59 | Attending: Obstetrics

## 2023-03-12 ENCOUNTER — Other Ambulatory Visit: Payer: Self-pay

## 2023-03-12 VITALS — BP 117/85 | HR 111 | Temp 97.4°F | Ht 69.0 in | Wt 219.5 lb

## 2023-03-12 DIAGNOSIS — O24419 Gestational diabetes mellitus in pregnancy, unspecified control: Secondary | ICD-10-CM

## 2023-03-12 DIAGNOSIS — O24113 Pre-existing diabetes mellitus, type 2, in pregnancy, third trimester: Secondary | ICD-10-CM | POA: Insufficient documentation

## 2023-03-12 DIAGNOSIS — O24112 Pre-existing diabetes mellitus, type 2, in pregnancy, second trimester: Secondary | ICD-10-CM

## 2023-03-12 DIAGNOSIS — O10913 Unspecified pre-existing hypertension complicating pregnancy, third trimester: Secondary | ICD-10-CM | POA: Diagnosis not present

## 2023-03-12 DIAGNOSIS — O3663X0 Maternal care for excessive fetal growth, third trimester, not applicable or unspecified: Secondary | ICD-10-CM

## 2023-03-12 DIAGNOSIS — E118 Type 2 diabetes mellitus with unspecified complications: Secondary | ICD-10-CM | POA: Diagnosis not present

## 2023-03-12 DIAGNOSIS — O09523 Supervision of elderly multigravida, third trimester: Secondary | ICD-10-CM | POA: Insufficient documentation

## 2023-03-12 DIAGNOSIS — O409XX0 Polyhydramnios, unspecified trimester, not applicable or unspecified: Secondary | ICD-10-CM | POA: Diagnosis not present

## 2023-03-12 DIAGNOSIS — Z3A36 36 weeks gestation of pregnancy: Secondary | ICD-10-CM | POA: Diagnosis not present

## 2023-03-12 DIAGNOSIS — Z7984 Long term (current) use of oral hypoglycemic drugs: Secondary | ICD-10-CM

## 2023-03-12 DIAGNOSIS — E1169 Type 2 diabetes mellitus with other specified complication: Secondary | ICD-10-CM | POA: Diagnosis not present

## 2023-03-12 DIAGNOSIS — O10013 Pre-existing essential hypertension complicating pregnancy, third trimester: Secondary | ICD-10-CM | POA: Diagnosis not present

## 2023-03-12 DIAGNOSIS — O10919 Unspecified pre-existing hypertension complicating pregnancy, unspecified trimester: Secondary | ICD-10-CM

## 2023-03-12 DIAGNOSIS — Z794 Long term (current) use of insulin: Secondary | ICD-10-CM

## 2023-03-12 DIAGNOSIS — O09522 Supervision of elderly multigravida, second trimester: Secondary | ICD-10-CM

## 2023-03-14 LAB — OB RESULTS CONSOLE HIV ANTIBODY (ROUTINE TESTING): HIV: NONREACTIVE

## 2023-03-14 LAB — OB RESULTS CONSOLE RPR: RPR: NONREACTIVE

## 2023-03-14 LAB — OB RESULTS CONSOLE GBS: GBS: NEGATIVE

## 2023-03-18 ENCOUNTER — Inpatient Hospital Stay
Admission: EM | Admit: 2023-03-18 | Discharge: 2023-03-21 | DRG: 768 | Disposition: A | Discharge status: Home / Self Care | Payer: 59 | Attending: Certified Nurse Midwife | Admitting: Certified Nurse Midwife

## 2023-03-18 ENCOUNTER — Inpatient Hospital Stay: Payer: 59 | Admitting: Anesthesiology

## 2023-03-18 ENCOUNTER — Encounter: Payer: Self-pay | Admitting: Obstetrics and Gynecology

## 2023-03-18 ENCOUNTER — Other Ambulatory Visit: Payer: Self-pay | Admitting: Obstetrics and Gynecology

## 2023-03-18 ENCOUNTER — Other Ambulatory Visit: Payer: Self-pay

## 2023-03-18 DIAGNOSIS — D62 Acute posthemorrhagic anemia: Secondary | ICD-10-CM | POA: Diagnosis not present

## 2023-03-18 DIAGNOSIS — Z794 Long term (current) use of insulin: Secondary | ICD-10-CM

## 2023-03-18 DIAGNOSIS — O9081 Anemia of the puerperium: Secondary | ICD-10-CM | POA: Diagnosis not present

## 2023-03-18 DIAGNOSIS — E119 Type 2 diabetes mellitus without complications: Secondary | ICD-10-CM | POA: Diagnosis present

## 2023-03-18 DIAGNOSIS — Z3A37 37 weeks gestation of pregnancy: Secondary | ICD-10-CM

## 2023-03-18 DIAGNOSIS — O1092 Unspecified pre-existing hypertension complicating childbirth: Secondary | ICD-10-CM | POA: Diagnosis present

## 2023-03-18 DIAGNOSIS — Z7984 Long term (current) use of oral hypoglycemic drugs: Secondary | ICD-10-CM | POA: Diagnosis not present

## 2023-03-18 DIAGNOSIS — O2412 Pre-existing diabetes mellitus, type 2, in childbirth: Secondary | ICD-10-CM | POA: Diagnosis present

## 2023-03-18 DIAGNOSIS — Z3490 Encounter for supervision of normal pregnancy, unspecified, unspecified trimester: Secondary | ICD-10-CM | POA: Diagnosis present

## 2023-03-18 DIAGNOSIS — Z7982 Long term (current) use of aspirin: Secondary | ICD-10-CM | POA: Diagnosis not present

## 2023-03-18 DIAGNOSIS — Z23 Encounter for immunization: Secondary | ICD-10-CM

## 2023-03-18 DIAGNOSIS — O4292 Full-term premature rupture of membranes, unspecified as to length of time between rupture and onset of labor: Secondary | ICD-10-CM | POA: Diagnosis present

## 2023-03-18 DIAGNOSIS — O99214 Obesity complicating childbirth: Secondary | ICD-10-CM | POA: Diagnosis present

## 2023-03-18 DIAGNOSIS — Z8249 Family history of ischemic heart disease and other diseases of the circulatory system: Secondary | ICD-10-CM | POA: Diagnosis not present

## 2023-03-18 HISTORY — DX: Essential (primary) hypertension: I10

## 2023-03-18 HISTORY — DX: Type 2 diabetes mellitus without complications: E11.9

## 2023-03-18 LAB — WET PREP, GENITAL
Clue Cells Wet Prep HPF POC: NONE SEEN
Sperm: NONE SEEN
Trich, Wet Prep: NONE SEEN
WBC, Wet Prep HPF POC: <10 (ref <10)
Yeast Wet Prep HPF POC: NONE SEEN

## 2023-03-18 LAB — COMPREHENSIVE METABOLIC PANEL
ALT: 14 U/L (ref 0–44)
AST: 22 U/L (ref 15–41)
Albumin: 3 g/dL — ABNORMAL LOW (ref 3.5–5.0)
Alkaline Phosphatase: 161 U/L — ABNORMAL HIGH (ref 38–126)
Anion gap: 11 (ref 5–15)
BUN: 5 mg/dL — ABNORMAL LOW (ref 6–20)
CO2: 18 mmol/L — ABNORMAL LOW (ref 22–32)
Calcium: 8.9 mg/dL (ref 8.9–10.3)
Chloride: 104 mmol/L (ref 98–111)
Creatinine, Ser: 0.45 mg/dL (ref 0.44–1.00)
GFR, Estimated: >60 mL/min (ref >60)
Glucose, Bld: 139 mg/dL — ABNORMAL HIGH (ref 70–99)
Potassium: 3.9 mmol/L (ref 3.5–5.1)
Sodium: 133 mmol/L — ABNORMAL LOW (ref 135–145)
Total Bilirubin: 0.8 mg/dL (ref 0.3–1.2)
Total Protein: 6.5 g/dL (ref 6.5–8.1)

## 2023-03-18 LAB — GLUCOSE, CAPILLARY
Glucose-Capillary: 137 mg/dL — ABNORMAL HIGH (ref 70–99)
Glucose-Capillary: 137 mg/dL — ABNORMAL HIGH (ref 70–99)
Glucose-Capillary: 117 mg/dL — ABNORMAL HIGH (ref 70–99)
Glucose-Capillary: 128 mg/dL — ABNORMAL HIGH (ref 70–99)
Glucose-Capillary: 126 mg/dL — ABNORMAL HIGH (ref 70–99)
Glucose-Capillary: 140 mg/dL — ABNORMAL HIGH (ref 70–99)

## 2023-03-18 LAB — URINALYSIS, COMPLETE (UACMP) WITH MICROSCOPIC
Bilirubin Urine: NEGATIVE
Glucose, UA: NEGATIVE mg/dL
Hgb urine dipstick: NEGATIVE
Ketones, ur: 40 mg/dL — AB
Leukocytes,Ua: NEGATIVE
Nitrite: NEGATIVE
Protein, ur: NEGATIVE mg/dL
Specific Gravity, Urine: 1.015 (ref 1.005–1.030)
pH: 6 (ref 5.0–8.0)

## 2023-03-18 LAB — PROTEIN / CREATININE RATIO, URINE
Creatinine, Urine: 84 mg/dL
Protein Creatinine Ratio: 0.21 mg/mg{creat} — ABNORMAL HIGH (ref 0.00–0.15)
Total Protein, Urine: 18 mg/dL

## 2023-03-18 LAB — CBC
HCT: 34 % — ABNORMAL LOW (ref 36.0–46.0)
Hemoglobin: 10.9 g/dL — ABNORMAL LOW (ref 12.0–15.0)
MCH: 21.8 pg — ABNORMAL LOW (ref 26.0–34.0)
MCHC: 32.1 g/dL (ref 30.0–36.0)
MCV: 68 fL — ABNORMAL LOW (ref 80.0–100.0)
Platelets: 252 10*3/uL (ref 150–400)
RBC: 5 MIL/uL (ref 3.87–5.11)
RDW: 13.9 % (ref 11.5–15.5)
WBC: 9.7 10*3/uL (ref 4.0–10.5)
nRBC: 0 % (ref 0.0–0.2)

## 2023-03-18 LAB — TYPE AND SCREEN
ABO/RH(D): O POS
Antibody Screen: NEGATIVE

## 2023-03-18 LAB — RUPTURE OF MEMBRANE (ROM)PLUS: Rom Plus: POSITIVE

## 2023-03-18 MED ORDER — LACTATED RINGERS IV SOLN: INTRAVENOUS | Status: DC

## 2023-03-18 MED ORDER — FENTANYL-BUPIVACAINE-NACL 0.5-0.125-0.9 MG/250ML-% EP SOLN
EPIDURAL | Status: DC | PRN
  Administered 2023-03-18: 12 mL/h via EPIDURAL

## 2023-03-18 MED ORDER — PENICILLIN G POT IN DEXTROSE 60000 UNIT/ML IV SOLN: 3.0000 10*6.[IU] | INTRAVENOUS | Status: DC

## 2023-03-18 MED ORDER — DIPHENHYDRAMINE HCL 50 MG/ML IJ SOLN
12.5000 mg | INTRAMUSCULAR | Status: DC | PRN
  Filled 2023-03-18: qty 1

## 2023-03-18 MED ORDER — DEXTROSE 50 % IV SOLN: 0.0000 mL | INTRAVENOUS | Status: DC | PRN

## 2023-03-18 MED ORDER — OXYTOCIN BOLUS FROM INFUSION
333.0000 mL | Freq: Once | INTRAVENOUS | Status: AC
  Administered 2023-03-19: 333 mL via INTRAVENOUS

## 2023-03-18 MED ORDER — FENTANYL CITRATE (PF) 100 MCG/2ML IJ SOLN
50.0000 ug | INTRAMUSCULAR | Status: DC | PRN
  Administered 2023-03-19: 100 ug via INTRAVENOUS
  Administered 2023-03-19: 50 ug via INTRAVENOUS
  Filled 2023-03-18 (×2): qty 2

## 2023-03-18 MED ORDER — PHENYLEPHRINE 80 MCG/ML (10ML) SYRINGE FOR IV PUSH (FOR BLOOD PRESSURE SUPPORT): 80.0000 ug | PREFILLED_SYRINGE | INTRAVENOUS | Status: DC | PRN

## 2023-03-18 MED ORDER — BUPIVACAINE HCL (PF) 0.25 % IJ SOLN
INTRAMUSCULAR | Status: DC | PRN
  Administered 2023-03-18 (×2): 4 mL via EPIDURAL

## 2023-03-18 MED ORDER — DIPHENHYDRAMINE HCL 50 MG/ML IJ SOLN
50.0000 mg | Freq: Once | INTRAMUSCULAR | Status: AC
  Administered 2023-03-18: 50 mg via INTRAVENOUS

## 2023-03-18 MED ORDER — OXYTOCIN-SODIUM CHLORIDE 30-0.9 UT/500ML-% IV SOLN
2.5000 [IU]/h | INTRAVENOUS | Status: DC
  Administered 2023-03-19: 2.5 [IU]/h via INTRAVENOUS

## 2023-03-18 MED ORDER — FENTANYL-BUPIVACAINE-NACL 0.5-0.125-0.9 MG/250ML-% EP SOLN
EPIDURAL | Status: AC
  Filled 2023-03-18: qty 250

## 2023-03-18 MED ORDER — OXYTOCIN-SODIUM CHLORIDE 30-0.9 UT/500ML-% IV SOLN
1.0000 m[IU]/min | INTRAVENOUS | Status: DC
  Administered 2023-03-18 (×2): 2 m[IU]/min via INTRAVENOUS
  Filled 2023-03-18: qty 500

## 2023-03-18 MED ORDER — ACETAMINOPHEN 325 MG PO TABS: 650.0000 mg | ORAL_TABLET | ORAL | Status: DC | PRN

## 2023-03-18 MED ORDER — NIFEDIPINE ER OSMOTIC RELEASE 30 MG PO TB24
90.0000 mg | ORAL_TABLET | Freq: Every day | ORAL | Status: DC
  Administered 2023-03-18: 90 mg via ORAL
  Filled 2023-03-18: qty 3

## 2023-03-18 MED ORDER — LIDOCAINE-EPINEPHRINE (PF) 1.5 %-1:200000 IJ SOLN
INTRAMUSCULAR | Status: DC | PRN
  Administered 2023-03-18: 4 mL via PERINEURAL

## 2023-03-18 MED ORDER — DEXTROSE IN LACTATED RINGERS 5 % IV SOLN: INTRAVENOUS | Status: DC

## 2023-03-18 MED ORDER — TERBUTALINE SULFATE 1 MG/ML IJ SOLN: 0.2500 mg | Freq: Once | INTRAMUSCULAR | Status: DC | PRN

## 2023-03-18 MED ORDER — LACTATED RINGERS IV SOLN
500.0000 mL | Freq: Once | INTRAVENOUS | Status: AC
  Administered 2023-03-18: 500 mL via INTRAVENOUS

## 2023-03-18 MED ORDER — ONDANSETRON HCL 4 MG/2ML IJ SOLN
4.0000 mg | Freq: Four times a day (QID) | INTRAMUSCULAR | Status: DC | PRN
  Administered 2023-03-18: 4 mg via INTRAVENOUS
  Filled 2023-03-18: qty 2

## 2023-03-18 MED ORDER — FENTANYL-BUPIVACAINE-NACL 0.5-0.125-0.9 MG/250ML-% EP SOLN: 12.0000 mL/h | EPIDURAL | Status: DC | PRN

## 2023-03-18 MED ORDER — INSULIN REGULAR(HUMAN) IN NACL 100-0.9 UT/100ML-% IV SOLN
INTRAVENOUS | Status: DC
  Administered 2023-03-18: 3.8 [IU]/h via INTRAVENOUS
  Filled 2023-03-18: qty 100

## 2023-03-18 MED ORDER — LACTATED RINGERS IV SOLN: 500.0000 mL | INTRAVENOUS | Status: DC | PRN

## 2023-03-18 MED ORDER — MISOPROSTOL 50MCG HALF TABLET
50.0000 ug | ORAL_TABLET | Freq: Once | ORAL | Status: AC
  Administered 2023-03-18: 50 ug via ORAL
  Filled 2023-03-18: qty 1

## 2023-03-18 MED ORDER — EPHEDRINE 5 MG/ML INJ: 10.0000 mg | INTRAVENOUS | Status: DC | PRN

## 2023-03-18 MED ORDER — INSULIN ASPART 100 UNIT/ML IJ SOLN
0.0000 [IU] | INTRAMUSCULAR | Status: DC
  Filled 2023-03-18: qty 1

## 2023-03-18 MED ORDER — LIDOCAINE HCL (PF) 1 % IJ SOLN
INTRAMUSCULAR | Status: DC | PRN
  Administered 2023-03-18: 3 mL via SUBCUTANEOUS

## 2023-03-18 MED ORDER — SOD CITRATE-CITRIC ACID 500-334 MG/5ML PO SOLN: 30.0000 mL | ORAL | Status: DC | PRN

## 2023-03-18 MED ORDER — SODIUM CHLORIDE 0.9 % IV SOLN: 5.0000 10*6.[IU] | Freq: Once | INTRAVENOUS | Status: DC

## 2023-03-18 MED ORDER — LIDOCAINE HCL (PF) 1 % IJ SOLN: 30.0000 mL | INTRAMUSCULAR | Status: DC | PRN

## 2023-03-18 NOTE — Progress Notes (Signed)
Labor Check  Subj:  Complaints: }has no unusual complaints  Obj:   Dose (milli-units/min) Oxytocin: 2 milli-units/min  Cervix: Dilation: 4.5 / Effacement (%): 70 / Station: -2  Baseline EXB:MWUXLKGM: 145 bpm, Variability: Good {> 6 bpm), Accelerations: Reactive, and Decelerations: Variable: mild Contractions: regular, every 2-3 minutes Overall assessment: reassuring   Current Vital Signs 24h Vital Sign Ranges  T 98 F (36.7 C) Temp  Avg: 98.4 F (36.9 C)  Min: 98 F (36.7 C)  Max: 98.8 F (37.1 C)  BP 111/82 BP  Min: 111/82  Max: 137/85  HR (!) 116 Pulse  Avg: 112.6  Min: 97  Max: 124  RR 18 Resp  Avg: 18  Min: 18  Max: 18  SaO2     No data recorded       Medications SCHEDULED MEDICATIONS   insulin aspart  0-14 Units Subcutaneous Q4H   NIFEdipine  90 mg Oral Daily   oxytocin 40 units in LR 1000 mL  333 mL Intravenous Once    MEDICATION INFUSIONS   lactated ringers     lactated ringers 125 mL/hr at 03/18/23 1340   oxytocin     oxytocin 2 milli-units/min (03/18/23 1727)    PRN MEDICATIONS  acetaminophen, fentaNYL (SUBLIMAZE) injection, lactated ringers, lidocaine (PF), ondansetron, sodium citrate-citric acid, terbutaline    A/P: 37 y.o. [id number] female at [redacted]w[redacted]d with Chronic Hypertension and Type II Diabetes Mellitus  1.  Labor: Satisfactory labor progress. Labor Progressing normally and  , preeclampsia labs stable, pain controlled  Epidural, and ID: SROM 7 hours 2.  DGU:[id number] assessment: Category II 3.  Group B Strep negative 4. Membranes ruptured, clear fluid, forebag ruptured 5.  Pain: level of pain (1-10, 10 severe), 8-9 6.  Recheck:Evaluated by digital exam. 7. Anticipate vaginal delivery. and Intervention: IV Pitocin augmentation  Chari Manning [facility] 03/18/2023 5:40 PM

## 2023-03-18 NOTE — Anesthesia Preprocedure Evaluation (Signed)
Anesthesia Evaluation  Patient identified by MRN, date of birth, ID band Patient awake    Reviewed: Allergy & Precautions, H&P , NPO status , Patient's Chart, lab work & pertinent test results, reviewed documented beta blocker date and time   Airway Mallampati: II  TM Distance: >3 FB Neck ROM: full    Dental no notable dental hx. (+) Teeth Intact   Pulmonary neg pulmonary ROS   Pulmonary exam normal breath sounds clear to auscultation       Cardiovascular Exercise Tolerance: Good hypertension, On Medications negative cardio ROS  Rhythm:regular Rate:Normal     Neuro/Psych negative neurological ROS  negative psych ROS   GI/Hepatic negative GI ROS, Neg liver ROS,,,  Endo/Other  negative endocrine ROSdiabetes, Type 1    Renal/GU      Musculoskeletal   Abdominal   Peds  Hematology negative hematology ROS (+)   Anesthesia Other Findings   Reproductive/Obstetrics (+) Pregnancy                             Anesthesia Physical Anesthesia Plan  ASA: 2  Anesthesia Plan: Epidural   Post-op Pain Management:    Induction:   PONV Risk Score and Plan:   Airway Management Planned:   Additional Equipment:   Intra-op Plan:   Post-operative Plan:   Informed Consent: I have reviewed the patients History and Physical, chart, labs and discussed the procedure including the risks, benefits and alternatives for the proposed anesthesia with the patient or authorized representative who has indicated his/her understanding and acceptance.       Plan Discussed with:   Anesthesia Plan Comments:        Anesthesia Quick Evaluation

## 2023-03-18 NOTE — Anesthesia Procedure Notes (Signed)
Epidural Patient location during procedure: OB Start time: 03/18/2023 6:18 PM End time: 03/18/2023 6:30 PM  Staffing Anesthesiologist: Yevette Edwards, MD Resident/CRNA: Irving Burton, CRNA Performed: resident/CRNA   Preanesthetic Checklist Completed: patient identified, IV checked, site marked, risks and benefits discussed, surgical consent, monitors and equipment checked, pre-op evaluation and timeout performed  Epidural Patient position: sitting Prep: ChloraPrep Patient monitoring: heart rate, continuous pulse ox and blood pressure Approach: midline Location: L3-L4 Injection technique: LOR saline  Needle:  Needle type: Tuohy  Needle gauge: 17 G Needle length: 9 cm and 9 Needle insertion depth: 8 cm Catheter type: closed end flexible Catheter size: 19 Gauge Catheter at skin depth: 12 cm Test dose: negative and 1.5% lidocaine with Epi 1:200 K  Assessment Sensory level: T10 Events: blood not aspirated, no cerebrospinal fluid, injection not painful, no injection resistance, no paresthesia and negative IV test  Additional Notes 1 attempt Pt. Evaluated and documentation done after procedure finished. Patient identified. Risks/Benefits/Options discussed with patient including but not limited to bleeding, infection, nerve damage, paralysis, failed block, incomplete pain control, headache, blood pressure changes, nausea, vomiting, reactions to medication both or allergic, itching and postpartum back pain. Confirmed with bedside nurse the patient's most recent platelet count. Confirmed with patient that they are not currently taking any anticoagulation, have any bleeding history or any family history of bleeding disorders. Patient expressed understanding and wished to proceed. All questions were answered. Sterile technique was used throughout the entire procedure. Please see nursing notes for vital signs. Test dose was given through epidural catheter and negative prior to continuing to  dose epidural or start infusion. Warning signs of high block given to the patient including shortness of breath, tingling/numbness in hands, complete motor block, or any concerning symptoms with instructions to call for help. Patient was given instructions on fall risk and not to get out of bed. All questions and concerns addressed with instructions to call with any issues or inadequate analgesia.    Patient tolerated the insertion well without immediate complications.Reason for block:procedure for pain

## 2023-03-18 NOTE — Plan of Care (Signed)
  Problem: Education: Goal: Knowledge of General Education information will improve Description: Including pain rating scale, medication(s)/side effects and non-pharmacologic comfort measures 03/18/2023 1301 by Rolene Arbour I, RN Outcome: Progressing 03/18/2023 1301 by Rolene Arbour I, RN Outcome: Progressing   Problem: Health Behavior/Discharge Planning: Goal: Ability to manage health-related needs will improve 03/18/2023 1301 by Rolene Arbour I, RN Outcome: Progressing 03/18/2023 1301 by Rolene Arbour I, RN Outcome: Progressing   Problem: Clinical Measurements: Goal: Ability to maintain clinical measurements within normal limits will improve 03/18/2023 1301 by Rolene Arbour I, RN Outcome: Progressing 03/18/2023 1301 by Rolene Arbour I, RN Outcome: Progressing Goal: Will remain free from infection 03/18/2023 1301 by Rolene Arbour I, RN Outcome: Progressing 03/18/2023 1301 by Rolene Arbour I, RN Outcome: Progressing Goal: Diagnostic test results will improve 03/18/2023 1301 by Rolene Arbour I, RN Outcome: Progressing 03/18/2023 1301 by Rolene Arbour I, RN Outcome: Progressing Goal: Respiratory complications will improve 03/18/2023 1301 by Rolene Arbour I, RN Outcome: Progressing 03/18/2023 1301 by Rolene Arbour I, RN Outcome: Progressing Goal: Cardiovascular complication will be avoided 03/18/2023 1301 by Rolene Arbour I, RN Outcome: Progressing 03/18/2023 1301 by Rolene Arbour I, RN Outcome: Progressing   Problem: Activity: Goal: Risk for activity intolerance will decrease 03/18/2023 1301 by Rolene Arbour I, RN Outcome: Progressing 03/18/2023 1301 by Rolene Arbour I, RN Outcome: Progressing   Problem: Nutrition: Goal: Adequate nutrition will be maintained 03/18/2023 1301 by Rolene Arbour I, RN Outcome: Progressing 03/18/2023 1301 by Rolene Arbour I, RN Outcome: Progressing   Problem: Coping: Goal: Level of anxiety will decrease 03/18/2023 1301 by Rolene Arbour I, RN Outcome: Progressing 03/18/2023 1301 by Rolene Arbour I, RN Outcome: Progressing   Problem: Elimination: Goal: Will not experience complications related to bowel motility 03/18/2023 1301 by Rolene Arbour I, RN Outcome: Progressing 03/18/2023 1301 by Rolene Arbour I, RN Outcome: Progressing Goal: Will not experience complications related to urinary retention 03/18/2023 1301 by Rolene Arbour I, RN Outcome: Progressing 03/18/2023 1301 by Rolene Arbour I, RN Outcome: Progressing   Problem: Pain Managment: Goal: General experience of comfort will improve 03/18/2023 1301 by Rolene Arbour I, RN Outcome: Progressing 03/18/2023 1301 by Rolene Arbour I, RN Outcome: Progressing   Problem: Safety: Goal: Ability to remain free from injury will improve 03/18/2023 1301 by Rolene Arbour I, RN Outcome: Progressing 03/18/2023 1301 by Rolene Arbour I, RN Outcome: Progressing   Problem: Skin Integrity: Goal: Risk for impaired skin integrity will decrease 03/18/2023 1301 by Rolene Arbour I, RN Outcome: Progressing 03/18/2023 1301 by Rolene Arbour I, RN Outcome: Progressing

## 2023-03-18 NOTE — OB Triage Note (Signed)
Pt reports to triage for possible rupture of membrane. Pt states at 1000 feeling a pop sensation. Pt reports leaking of fluid at 1033 no odor and no color. Pt states went to restroom and wiped and noted wetness shortly after cleaning up. Pt states took a shower and then was wet again. Pt reports positive fetal movement, no bleeding, and lower back pain at a 4-5/10 pain scale. Pt placed on continuous monitoring. Vitals assessed.

## 2023-03-18 NOTE — Inpatient Diabetes Management (Signed)
ADA Standards of Care 2023 Diabetes in Pregnancy Target Glucose Ranges:  Fasting: 70 - 95 mg/dL 1 hr postprandial:  161 - 140mg /dL (from first bite of meal) 2 hr postprandial:  100 - 120 mg/dL (from first bit of meal)    Admit with: Presents to L&D for SROM   History: DM2, GDM  Home DM Meds: Lantus 24 units AM/ 32 units PM       Regular Insulin 26 units AM/ 16 units PM       Metformin 500 mg BID  Current Orders: CBGs Q4 hours     If pt has 2 consecutive CBGs >120, please consider starting IV Insulin Drip with Endotool     --Will follow patient during hospitalization--  Ambrose Finland RN, MSN, CDCES Diabetes Coordinator Inpatient Glycemic Control Team Team Pager: [phone] (8a-5p)

## 2023-03-18 NOTE — H&P (Signed)
OB History & Physical   History of Present Illness:   Chief Complaint: LOF  HPI:  Diana [name] is a 37 y.o. G56P3003 female at [redacted]w[redacted]d, Patient's last menstrual period was 06/30/2022., consistent with [place] at [redacted]w[redacted]d, with Estimated Date of Delivery: 04/06/23.  She presents to L&D for SROM  Reports active fetal movement  Contractions: irregular cramping  LOF/SROM: 1000 Vaginal bleeding: none  Factors complicating pregnancy:  Diabetes Type 2 CHTN AMA obesity    Patient Active Problem List   Diagnosis Date Noted   Encounter for planned induction of labor 03/18/2023   Chronic hypertension affecting pregnancy 01/02/2023   Pre-existing type 2 diabetes mellitus during pregnancy in second trimester 01/02/2023   Vaginal bleeding in pregnancy, second trimester 10/07/2022   Multigravida of advanced maternal age in second trimester 08/30/2022   Gestational diabetes mellitus (GDM) affecting pregnancy 08/11/2015    Prenatal Transfer Tool  Maternal Diabetes: Yes:  Diabetes Type:  Insulin/Medication controlled Genetic Screening: Normal Maternal Ultrasounds/Referrals: Normal Fetal Ultrasounds or other Referrals:  Referred to Materal Fetal Medicine  Maternal Substance Abuse:  No Significant Maternal Medications:  Meds include: Other:  Significant Maternal Lab Results: Group B Strep negative  Maternal Medical History:   Past Medical History:  Diagnosis Date   Diabetes mellitus without complication (HCC)    Hypertension    Labor and delivery indication for care or intervention 08/17/2015   Oligohydramnios, antepartum 08/17/2015   Postpartum care following vaginal delivery 08/18/2015    Past Surgical History:  Procedure Laterality Date   NO PAST SURGERIES      Allergies  Allergen Reactions   Morphine And Codeine Rash    Prior to Admission medications   Medication Sig Start Date End Date Taking? Authorizing Provider  aspirin 81 MG chewable tablet Chew 81 mg by mouth daily.   Yes  [provider]  calcium carbonate (TUMS EX) 750 MG chewable tablet Chew 1 tablet by mouth daily.   Yes [provider]  insulin glargine (LANTUS) 100 UNIT/ML injection Inject 53 Units into the skin at bedtime. Lantus Solo Star U-100 Insulin Pen injector 35 HS 18AM   Yes [provider]  insulin regular (NOVOLIN R) 100 units/mL injection Inject 38 Units into the skin 2 (two) times daily before a meal. 22 units every morning with the first bite of food 16 every night   Yes [provider]  metFORMIN (GLUCOPHAGE-XR) 500 MG 24 hr tablet Take 500 mg by mouth 2 (two) times daily with a meal.   Yes [provider]  NIFEdipine (PROCARDIA XL/NIFEDICAL-XL) 90 MG 24 hr tablet Take 90 mg by mouth daily.   Yes [provider]  Prenatal Vit-Fe Fumarate-FA (MULTIVITAMIN-PRENATAL) 27-0.8 MG TABS tablet Take 1 tablet by mouth daily at 12 noon.   Yes [provider]  insulin aspart (NOVOLOG) 100 UNIT/ML FlexPen Inject 22 Units into the skin daily before breakfast AND 4 Units daily before lunch AND 27 Units daily before supper. 02/10/23 03/12/23  Braxton Feathers, DO     Prenatal care site:  [facility]  OB History  Gravida Para Term Preterm AB Living  4 3 3  0 0 3  SAB IAB Ectopic Multiple Live Births  0 0 0 0 3    # Outcome Date GA Lbr Len/2nd Weight Sex Type Anes PTL Lv  4 Current           3 Term 12/29/16 [redacted]w[redacted]d 07:02 / 02:20 3940 g M Vag-Spont EPI  LIV  Name: Diana [name]     Apgar5: 9  2 Term 08/18/15 [redacted]w[redacted]d / 02:18 3730 g F Vag-Spont EPI  LIV     Name: Diana [name]     Apgar1: 8  Apgar5: 9  1 Term     F    LIV     Social History: She  reports that she has never smoked. She has never used smokeless tobacco. She reports that she does not drink alcohol and does not use drugs.  Family History: family history includes Diabetes in her paternal grandmother; Hypertension in her father and paternal grandmother.    Review of Systems: A full review of systems was performed and negative except as noted in the HPI.     Physical Exam:  Vital Signs: BP 127/86 (BP Location: Right Arm)   Pulse (!) 122   Temp 98.8 F (37.1 C) (Axillary)   Resp 18   Ht 5\' 8"  (1.727 m)   Wt 98 kg   LMP 06/30/2022   BMI 32.84 kg/m   General: no acute distress.  HEENT: normocephalic, atraumatic Heart: regular rate & rhythm Lungs: normal respiratory effort Abdomen: soft, gravid, non-tender;  EFW: 9.11 lbs on 03/12/23 by MFM Pelvic:   External: Normal external female genitalia  Cervix: Dilation: 2 / Effacement (%): 50 /      Extremities: non-tender, symmetric, no edema bilaterally.  DTRs: +2  Neurologic: Alert & oriented x 3.    Results for orders placed or performed during the hospital encounter of 03/18/23 (from the past 24 hour(s))  Rupture of Membrane (ROM) Plus     Status: None   Collection Time: 03/18/23 12:03 PM  Result Value Ref Range   Rom Plus POSITIVE   Wet prep, genital     Status: None   Collection Time: 03/18/23 12:03 PM  Result Value Ref Range   Yeast Wet Prep HPF POC NONE SEEN NONE SEEN   Trich, Wet Prep NONE SEEN NONE SEEN   Clue Cells Wet Prep HPF POC NONE SEEN NONE SEEN   WBC, Wet Prep HPF POC <10 <10   Sperm NONE SEEN   Urinalysis, Complete w Microscopic -Urine, Clean Catch     Status: Abnormal   Collection Time: 03/18/23 12:03 PM  Result Value Ref Range   Color, Urine YELLOW YELLOW   APPearance HAZY (A) CLEAR   Specific Gravity, Urine 1.015 1.005 - 1.030   pH 6.0 5.0 - 8.0   Glucose, UA NEGATIVE NEGATIVE mg/dL   Hgb urine dipstick NEGATIVE NEGATIVE   Bilirubin Urine NEGATIVE NEGATIVE   Ketones, ur 40 (A) NEGATIVE mg/dL   Protein, ur NEGATIVE NEGATIVE mg/dL   Nitrite NEGATIVE NEGATIVE   Leukocytes,Ua NEGATIVE NEGATIVE   RBC / HPF 0-5 0 - 5 RBC/hpf   WBC, UA 0-5 0 - 5 WBC/hpf   Bacteria, UA RARE (A) NONE SEEN   Squamous Epithelial / HPF 6-10 0 - 5 /HPF   Mucus PRESENT      Pertinent Results:  Prenatal Labs:  Blood type/Rh O POS  Antibody screen neg  Rubella Immund    Varicella Immune  RPR NR  HBsAg   NR  Hep C NR  HIV   NR  GC neg  Chlamydia neg  Genetic screening cfDNA negative  1 hour GTT N/a  3 hour GTT N/a  GBS Negative     FHT:  FHR: 145 bpm, variability: moderate,  accelerations:  Present,  decelerations:  Present mild variables Category/reactivity:  Category II UC:  irregular   [place] and SVE   [place] MFM OB FOLLOW UP  Result Date: 03/12/2023 ----------------------------------------------------------------------  OBSTETRICS REPORT                       (Signed Final 03/12/2023 01:37 pm) ---------------------------------------------------------------------- Patient Info  ID #:       [id number]                          D.O.B.:  12-31-1985 (36 yrs)  Name:       MERON NOHL                Visit Date: 03/12/2023 10:57 am ---------------------------------------------------------------------- Performed By  Attending:        Ma Rings MD         Ref. Address:     [facility]  Performed By:     Reinaldo Raddle            [facility]         [facility]                                     [facility] at                                                             [facility]  Referred By:      Tomasita Morrow CNM ---------------------------------------------------------------------- Orders  #  Description                           Code        Ordered By  1  [place] MFM OB FOLLOW UP                   [phone]    YU FANG  2  [place] MFM FETAL BPP WO NON               76819.01    YU FANG     STRESS ----------------------------------------------------------------------  #  Order #                     Accession #                Episode #  1  [id number]                   [id number]                 [id number]  2  [id number]                   [id number]                 [id number]  ---------------------------------------------------------------------- Indications  Hypertension - Chronic/Pre-existing            O10.019  (Procardia)  Advanced maternal age multigravida 35+,  W09.811  third trimester  Pre-existing diabetes, type 2, in pregnancy,   O24.113  third trimester  Large for gestational age fetus affecting      O36.60X0  management of mother  [redacted] weeks gestation of pregnancy                Z3A.36  Normal FE w/ [facility] ---------------------------------------------------------------------- Fetal Evaluation  Num Of Fetuses:         1  Fetal Heart Rate(bpm):  143  Cardiac Activity:       Observed  Presentation:           Cephalic  Placenta:               Posterior  P. Cord Insertion:      Previously seen  Amniotic Fluid  AFI FV:      Polyhydramnios  AFI Sum(cm)     %Tile       Largest Pocket(cm)  24.33           94          8.93  RUQ(cm)       RLQ(cm)       LUQ(cm)        LLQ(cm)  8.73          3.63          8.93           3.04 ---------------------------------------------------------------------- Biophysical Evaluation  Amniotic F.V:   Within normal limits       F. Tone:        Observed  F. Movement:    Observed                   Score:          8/8  F. Breathing:   Observed ---------------------------------------------------------------------- Biometry  BPD:     91.26  mm     G. Age:  37w 0d         77  %    CI:         78.5   %    70 - 86                                                          FL/HC:      21.8   %    20.1 - 22.1  HC:    325.79   mm     G. Age:  36w 6d         33  %    HC/AC:      0.80        0.93 - 1.11  AC:    408.63   mm     G. Age:  44w 6d       > 99  %    FL/BPD:     77.8   %    71 - 87  FL:      71.04  mm     G. Age:  36w 3d         46  %    FL/AC:      17.4   %    20 - 24  HUM:      64.5  mm     G. Age:  37w 3d  85  %  Est. FW:    4403  gm    9 lb 11 oz    > 99  % ---------------------------------------------------------------------- OB History  Gravidity:     4         Term:   3        Prem:   0        SAB:   0  TOP:          0       Ectopic:  0        Living: 3 ---------------------------------------------------------------------- Gestational Age  LMP:           36w 3d        Date:  06/30/22                 EDD:   04/06/23  U/S Today:     38w 6d                                        EDD:   03/20/23  Best:          36w 3d     Det. ByMarcella Dubs         EDD:   04/06/23                                      (08/30/22) ---------------------------------------------------------------------- Anatomy  Cranium:               Appears normal         LVOT:                   Appears normal  Cavum:                 Appears normal         Aortic Arch:            Previously seen  Ventricles:            Previously seen        Ductal Arch:            Previously seen  Choroid Plexus:        Previously seen        Diaphragm:              Appears normal  Cerebellum:            Previously seen        Stomach:                Appears normal, left                                                                        sided  Posterior Fossa:       Previously seen        Abdomen:                Appears normal  Nuchal Fold:           Previously seen  Abdominal Wall:         Previously seen  Face:                  Profile nl; orbits     Cord Vessels:           Previously seen                         prev visualized  Lips:                  Previously seen        Kidneys:                Appear normal  Palate:                Previously             Bladder:                Appears normal                         visualized  Thoracic:              Appears normal         Spine:                  Previously seen  Heart:                 Appears normal         Upper Extremities:      Previously seen                         (4CH, axis, and                         situs)  RVOT:                  Previously seen        Lower Extremities:      Previously seen  Other:  Female gender previously seen.Feet,  open hands/5th digits, nasal          bone, lenses, maxilla, mandible and falx previously visualized..VC,          3VV and 3VTV previously visualized. ---------------------------------------------------------------------- Cervix Uterus Adnexa  Cervix  Not visualized (advanced GA >24wks)  Uterus  No abnormality visualized.  Right Ovary  Not visualized.  Left Ovary  Not visualized.  Cul De Sac  No free fluid seen.  Adnexa  No adnexal mass visualized ---------------------------------------------------------------------- Comments  This patient was seen for a follow up growth scan and BPP  due to advanced maternal age, chronic hypertension treated  with Procardia, and pregestational diabetes treated with  insulin and metformin.  She denies any problems since her  last exam.  She was informed that the EFW obtained today of 9 pounds  11 ounces measures at the 99th percentile for her gestational  age.  Mild polyhydramnios with a total AFI of close to 25 cm  continues to be noted.  A BPP performed today was 8 out of 8.  Due to her underlying medical conditions, she is already  scheduled for induction of labor on March 19, 2023 (at  37+ weeks).  The increased risk of shoulder dystocia due to the large for  gestational age fetus was discussed.  The patient  understands inaccuracies of estimation of the fetal weight at  term.  She has given birth to children weighing 8-1/2 pounds  in the past.  As the overall EFW is less than 4500 g today, hopefully she  will have a successful vaginal delivery.  No further exams were scheduled in our office. ----------------------------------------------------------------------                   Ma Rings, MD Electronically Signed Final Report   03/12/2023 01:37 pm ----------------------------------------------------------------------   [place] MFM FETAL BPP WO NON STRESS  Result Date: 03/12/2023 ----------------------------------------------------------------------  OBSTETRICS REPORT                        (Signed Final 03/12/2023 01:37 pm) ---------------------------------------------------------------------- Patient Info  ID #:       [id number]                          D.O.B.:  [birth year] (36 yrs)  Name:       TUYET HEYDE                Visit Date: 03/12/2023 10:57 am ---------------------------------------------------------------------- Performed By  Attending:        Ma Rings MD         Ref. Address:     [facility]  Performed By:     Reinaldo Raddle            Location[facility]         [facility]                                     Fetal Care at                                                             [facility]  Referred By:      Tomasita Morrow CNM ---------------------------------------------------------------------- Orders  #  Description                           Code        Ordered By  1  [place] MFM OB FOLLOW UP                   76816.01    YU FANG  2  [place] MFM FETAL BPP WO NON               86578.46    YU FANG     STRESS ----------------------------------------------------------------------  #  Order #                     Accession #                Episode #  1  [id number]                   [id number]  [id number]  2  [id number]                   [id number]                 [id number] ---------------------------------------------------------------------- Indications  Hypertension - Chronic/Pre-existing            O10.019  (Procardia)  Advanced maternal age multigravida 44+,        O68.523  third trimester  Pre-existing diabetes, type 2, in pregnancy,   O24.113  third trimester  Large for gestational age fetus affecting      O36.60X0  management of mother  [redacted] weeks gestation of pregnancy                Z3A.36  Normal FE w/ [facility] ---------------------------------------------------------------------- Fetal Evaluation  Num Of Fetuses:         1  Fetal Heart Rate(bpm):  143  Cardiac Activity:       Observed  Presentation:            Cephalic  Placenta:               Posterior  P. Cord Insertion:      Previously seen  Amniotic Fluid  AFI FV:      Polyhydramnios  AFI Sum(cm)     %Tile       Largest Pocket(cm)  24.33           94          8.93  RUQ(cm)       RLQ(cm)       LUQ(cm)        LLQ(cm)  8.73          3.63          8.93           3.04 ---------------------------------------------------------------------- Biophysical Evaluation  Amniotic F.V:   Within normal limits       F. Tone:        Observed  F. Movement:    Observed                   Score:          8/8  F. Breathing:   Observed ---------------------------------------------------------------------- Biometry  BPD:     91.26  mm     G. Age:  37w 0d         77  %    CI:         78.5   %    70 - 86                                                          FL/HC:      21.8   %    20.1 - 22.1  HC:    325.79   mm     G. Age:  36w 6d         33  %    HC/AC:      0.80        0.93 - 1.11  AC:    408.63   mm     G. Age:  44w 6d       > 99  %    FL/BPD:     77.8   %  71 - 87  FL:      71.04  mm     G. Age:  36w 3d         46  %    FL/AC:      17.4   %    20 - 24  HUM:      64.5  mm     G. Age:  37w 3d         85  %  Est. FW:    4403  gm    9 lb 11 oz    > 99  % ---------------------------------------------------------------------- OB History  Gravidity:    4         Term:   3        Prem:   0        SAB:   0  TOP:          0       Ectopic:  0        Living: 3 ---------------------------------------------------------------------- Gestational Age  LMP:           36w 3d        Date:  06/30/22                 EDD:   04/06/23  U/S Today:     38w 6d                                        EDD:   03/20/23  Best:          36w 3d     Det. ByMarcella Dubs         EDD:   04/06/23                                      (08/30/22) ---------------------------------------------------------------------- Anatomy  Cranium:               Appears normal         LVOT:                   Appears normal  Cavum:                  Appears normal         Aortic Arch:            Previously seen  Ventricles:            Previously seen        Ductal Arch:            Previously seen  Choroid Plexus:        Previously seen        Diaphragm:              Appears normal  Cerebellum:            Previously seen        Stomach:                Appears normal, left  sided  Posterior Fossa:       Previously seen        Abdomen:                Appears normal  Nuchal Fold:           Previously seen        Abdominal Wall:         Previously seen  Face:                  Profile nl; orbits     Cord Vessels:           Previously seen                         prev visualized  Lips:                  Previously seen        Kidneys:                Appear normal  Palate:                Previously             Bladder:                Appears normal                         visualized  Thoracic:              Appears normal         Spine:                  Previously seen  Heart:                 Appears normal         Upper Extremities:      Previously seen                         (4CH, axis, and                         situs)  RVOT:                  Previously seen        Lower Extremities:      Previously seen  Other:  Female gender previously seen.Feet, open hands/5th digits, nasal          bone, lenses, maxilla, mandible and falx previously visualized..VC,          3VV and 3VTV previously visualized. ---------------------------------------------------------------------- Cervix Uterus Adnexa  Cervix  Not visualized (advanced GA >24wks)  Uterus  No abnormality visualized.  Right Ovary  Not visualized.  Left Ovary  Not visualized.  Cul De Sac  No free fluid seen.  Adnexa  No adnexal mass visualized ---------------------------------------------------------------------- Comments  This patient was seen for a follow up growth scan and BPP  due to advanced maternal age, chronic hypertension treated   with Procardia, and pregestational diabetes treated with  insulin and metformin.  She denies any problems since her  last exam.  She was informed that the EFW obtained today of 9 pounds  11 ounces measures at the 99th percentile for her gestational  age.  Mild polyhydramnios with a total AFI of close to 25 cm  continues to be noted.  A BPP performed today was 8 out of 8.  Due to her underlying medical conditions, she is already  scheduled for induction of labor on March 19, 2023 (at  37+ weeks).  The increased risk of shoulder dystocia due to the large for  gestational age fetus was discussed.  The patient  understands inaccuracies of estimation of the fetal weight at  term.  She has given birth to children weighing 8-1/2 pounds  in the past.  As the overall EFW is less than 4500 g today, hopefully she  will have a successful vaginal delivery.  No further exams were scheduled in our office. ----------------------------------------------------------------------                   Ma Rings, MD Electronically Signed Final Report   03/12/2023 01:37 pm ----------------------------------------------------------------------    Assessment:  Diana [name] is a 37 y.o. [phone] female at [redacted]w[redacted]d AMA with CHTN, Diabetes type 2 and obesity..   Plan:  1. Admit to Labor & Delivery - consents reviewed and obtained - Dr. Feliberto Gottron notified of admission and plan of care   2. Fetal Well being  - Fetal Tracing: Category 2 - Group B Streptococcus ppx not indicated: GBS negative - Presentation: Cephalic confirmed by SVE   3. Routine OB: - Prenatal labs reviewed, as above - Rh positive - CBC, T&S, RPR on admit - Clear liquid diet , continuous IV fluids  4. Monitoring of labor  - Contractions monitored with external toco - Pelvis proven to 3940g adequate for trial of labor  - Plan for expectant management  - Augmentation with misoprostol and oxytocin as appropriate  - Plan for  continuous fetal  monitoring - Maternal pain control as desired; planning regional anesthesia and IVPM - Anticipate vaginal delivery  5. Post Partum Planning: - Infant feeding: breast feeding - Contraception: bilateral tubal ligation - Tdap vaccine: Given prenatally - Flu vaccine:  not in season  Mika Anastasi LUCY Delmar Landau, CNM 03/18/23 1:14 PM  Chari Manning, CNM Certified Nurse Midwife [facility]

## 2023-03-19 ENCOUNTER — Encounter: Payer: Self-pay | Admitting: Obstetrics and Gynecology

## 2023-03-19 LAB — BLOOD GAS, ARTERIAL
Acid-base deficit: 8.4 mmol/L — ABNORMAL HIGH (ref 0.0–2.0)
Bicarbonate: 19.6 mmol/L — ABNORMAL LOW (ref 20.0–28.0)
Patient temperature: 37
pCO2 arterial: 48 mmHg (ref 32–48)
pH, Arterial: 7.22 — ABNORMAL LOW (ref 7.35–7.45)
pO2, Arterial: 36 mmHg — CL (ref 83–108)

## 2023-03-19 LAB — GLUCOSE, CAPILLARY
Glucose-Capillary: 143 mg/dL — ABNORMAL HIGH (ref 70–99)
Glucose-Capillary: 122 mg/dL — ABNORMAL HIGH (ref 70–99)
Glucose-Capillary: 135 mg/dL — ABNORMAL HIGH (ref 70–99)
Glucose-Capillary: 102 mg/dL — ABNORMAL HIGH (ref 70–99)
Glucose-Capillary: 142 mg/dL — ABNORMAL HIGH (ref 70–99)

## 2023-03-19 LAB — CBC
HCT: 31.9 % — ABNORMAL LOW (ref 36.0–46.0)
HCT: 30.9 % — ABNORMAL LOW (ref 36.0–46.0)
Hemoglobin: 10.1 g/dL — ABNORMAL LOW (ref 12.0–15.0)
Hemoglobin: 9.5 g/dL — ABNORMAL LOW (ref 12.0–15.0)
MCH: 21.9 pg — ABNORMAL LOW (ref 26.0–34.0)
MCH: 21.7 pg — ABNORMAL LOW (ref 26.0–34.0)
MCHC: 31.7 g/dL (ref 30.0–36.0)
MCHC: 30.7 g/dL (ref 30.0–36.0)
MCV: 69.2 fL — ABNORMAL LOW (ref 80.0–100.0)
MCV: 70.5 fL — ABNORMAL LOW (ref 80.0–100.0)
Platelets: 238 10*3/uL (ref 150–400)
Platelets: 231 10*3/uL (ref 150–400)
RBC: 4.61 MIL/uL (ref 3.87–5.11)
RBC: 4.38 MIL/uL (ref 3.87–5.11)
RDW: 14 % (ref 11.5–15.5)
RDW: 13.9 % (ref 11.5–15.5)
WBC: 15.7 10*3/uL — ABNORMAL HIGH (ref 4.0–10.5)
WBC: 16.6 10*3/uL — ABNORMAL HIGH (ref 4.0–10.5)
nRBC: 0 % (ref 0.0–0.2)
nRBC: 0 % (ref 0.0–0.2)

## 2023-03-19 LAB — BLOOD GAS, VENOUS
Acid-base deficit: 9.7 mmol/L — ABNORMAL HIGH (ref 0.0–2.0)
Bicarbonate: 22.6 mmol/L (ref 20.0–28.0)
O2 Saturation: 29.2 %
Patient temperature: 37
pCO2, Ven: 78 mmHg (ref 44–60)
pH, Ven: 7.07 — CL (ref 7.25–7.43)
pO2, Ven: <31 mmHg — CL (ref 32–45)

## 2023-03-19 LAB — RPR: RPR Ser Ql: NONREACTIVE

## 2023-03-19 MED ORDER — SODIUM CHLORIDE 0.9 % IV SOLN
300.0000 mg | Freq: Once | INTRAVENOUS | Status: AC
  Administered 2023-03-19: 300 mg via INTRAVENOUS
  Filled 2023-03-19: qty 15

## 2023-03-19 MED ORDER — NIFEDIPINE ER OSMOTIC RELEASE 30 MG PO TB24
30.0000 mg | ORAL_TABLET | Freq: Every day | ORAL | Status: DC
  Administered 2023-03-19 – 2023-03-21 (×3): 30 mg via ORAL
  Filled 2023-03-19 (×3): qty 1

## 2023-03-19 MED ORDER — SENNOSIDES-DOCUSATE SODIUM 8.6-50 MG PO TABS
2.0000 | ORAL_TABLET | Freq: Every day | ORAL | Status: DC
  Administered 2023-03-19 – 2023-03-21 (×3): 2 via ORAL
  Filled 2023-03-19 (×3): qty 2

## 2023-03-19 MED ORDER — CEFAZOLIN SODIUM-DEXTROSE 2-4 GM/100ML-% IV SOLN
INTRAVENOUS | Status: AC
  Filled 2023-03-19: qty 100

## 2023-03-19 MED ORDER — DOCUSATE SODIUM 100 MG PO CAPS: 100.0000 mg | ORAL_CAPSULE | Freq: Two times a day (BID) | ORAL | Status: DC

## 2023-03-19 MED ORDER — OXYCODONE-ACETAMINOPHEN 7.5-325 MG PO TABS
1.0000 | ORAL_TABLET | ORAL | Status: DC | PRN
  Administered 2023-03-19 – 2023-03-21 (×8): 1 via ORAL
  Filled 2023-03-19 (×8): qty 1

## 2023-03-19 MED ORDER — CEFAZOLIN SODIUM-DEXTROSE 2-4 GM/100ML-% IV SOLN
2.0000 g | Freq: Once | INTRAVENOUS | Status: AC
  Administered 2023-03-19: 2 g via INTRAVENOUS

## 2023-03-19 MED ORDER — BENZOCAINE-MENTHOL 20-0.5 % EX AERO
1.0000 | INHALATION_SPRAY | CUTANEOUS | Status: DC | PRN
  Filled 2023-03-19: qty 56

## 2023-03-19 MED ORDER — METFORMIN HCL ER 500 MG PO TB24
500.0000 mg | ORAL_TABLET | Freq: Two times a day (BID) | ORAL | Status: DC
  Administered 2023-03-19 – 2023-03-21 (×5): 500 mg via ORAL
  Filled 2023-03-19 (×7): qty 1

## 2023-03-19 MED ORDER — TRANEXAMIC ACID-NACL 1000-0.7 MG/100ML-% IV SOLN
INTRAVENOUS | Status: AC
  Administered 2023-03-19: 10 mg via INTRAVENOUS
  Filled 2023-03-19: qty 100

## 2023-03-19 MED ORDER — INFLUENZA VIRUS VACC SPLIT PF (FLUZONE) 0.5 ML IM SUSY
0.5000 mL | PREFILLED_SYRINGE | INTRAMUSCULAR | Status: AC
  Administered 2023-03-21: 0.5 mL via INTRAMUSCULAR
  Filled 2023-03-19: qty 0.5

## 2023-03-19 MED ORDER — OXYCODONE HCL 5 MG PO TABS: 5.0000 mg | ORAL_TABLET | ORAL | Status: DC | PRN

## 2023-03-19 MED ORDER — TRANEXAMIC ACID-NACL 1000-0.7 MG/100ML-% IV SOLN: 1000.0000 mg | INTRAVENOUS | Status: DC

## 2023-03-19 MED ORDER — ZOLPIDEM TARTRATE 5 MG PO TABS: 5.0000 mg | ORAL_TABLET | Freq: Every evening | ORAL | Status: DC | PRN

## 2023-03-19 MED ORDER — DIBUCAINE (PERIANAL) 1 % EX OINT
1.0000 | TOPICAL_OINTMENT | CUTANEOUS | Status: DC | PRN
  Filled 2023-03-19: qty 28

## 2023-03-19 MED ORDER — ACETAMINOPHEN 325 MG PO TABS
650.0000 mg | ORAL_TABLET | ORAL | Status: DC | PRN
  Administered 2023-03-19 (×2): 650 mg via ORAL
  Filled 2023-03-19 (×2): qty 2

## 2023-03-19 MED ORDER — IBUPROFEN 600 MG PO TABS
600.0000 mg | ORAL_TABLET | Freq: Four times a day (QID) | ORAL | Status: DC
  Administered 2023-03-19 – 2023-03-21 (×10): 600 mg via ORAL
  Filled 2023-03-19 (×10): qty 1

## 2023-03-19 MED ORDER — LIDOCAINE HCL (PF) 1 % IJ SOLN
INTRAMUSCULAR | Status: AC
  Filled 2023-03-19: qty 30

## 2023-03-19 MED ORDER — SIMETHICONE 80 MG PO CHEW: 80.0000 mg | CHEWABLE_TABLET | ORAL | Status: DC | PRN

## 2023-03-19 MED ORDER — ONDANSETRON HCL 4 MG PO TABS: 4.0000 mg | ORAL_TABLET | ORAL | Status: DC | PRN

## 2023-03-19 MED ORDER — ONDANSETRON HCL 4 MG/2ML IJ SOLN: 4.0000 mg | INTRAMUSCULAR | Status: DC | PRN

## 2023-03-19 MED ORDER — PRENATAL MULTIVITAMIN CH
1.0000 | ORAL_TABLET | Freq: Every day | ORAL | Status: DC
  Administered 2023-03-19 – 2023-03-21 (×3): 1 via ORAL
  Filled 2023-03-19 (×3): qty 1

## 2023-03-19 MED ORDER — IBUPROFEN 600 MG PO TABS
ORAL_TABLET | ORAL | Status: AC
  Filled 2023-03-19: qty 1

## 2023-03-19 MED ORDER — SENNOSIDES-DOCUSATE SODIUM 8.6-50 MG PO TABS: 2.0000 | ORAL_TABLET | Freq: Every day | ORAL | Status: DC

## 2023-03-19 MED ORDER — DOCUSATE SODIUM 100 MG PO CAPS
100.0000 mg | ORAL_CAPSULE | Freq: Two times a day (BID) | ORAL | Status: DC
  Administered 2023-03-19 – 2023-03-21 (×5): 100 mg via ORAL
  Filled 2023-03-19 (×5): qty 1

## 2023-03-19 MED ORDER — OXYCODONE HCL 5 MG PO TABS
10.0000 mg | ORAL_TABLET | ORAL | Status: DC | PRN
  Administered 2023-03-19 (×2): 10 mg via ORAL
  Filled 2023-03-19 (×2): qty 2

## 2023-03-19 MED ORDER — FERROUS SULFATE 325 (65 FE) MG PO TABS
325.0000 mg | ORAL_TABLET | Freq: Two times a day (BID) | ORAL | Status: DC
  Administered 2023-03-19 – 2023-03-21 (×5): 325 mg via ORAL
  Filled 2023-03-19 (×5): qty 1

## 2023-03-19 MED ORDER — POLYETHYLENE GLYCOL 3350 17 G PO PACK
17.0000 g | PACK | Freq: Every day | ORAL | Status: DC
  Administered 2023-03-19 – 2023-03-21 (×3): 17 g via ORAL
  Filled 2023-03-19 (×3): qty 1

## 2023-03-19 MED ORDER — TETANUS-DIPHTH-ACELL PERTUSSIS 5-2.5-18.5 LF-MCG/0.5 IM SUSY: 0.5000 mL | PREFILLED_SYRINGE | Freq: Once | INTRAMUSCULAR | Status: DC

## 2023-03-19 MED ORDER — WITCH HAZEL-GLYCERIN EX PADS: 1.0000 | MEDICATED_PAD | CUTANEOUS | Status: DC | PRN

## 2023-03-19 MED ORDER — COCONUT OIL OIL
1.0000 | TOPICAL_OIL | Status: DC | PRN
  Filled 2023-03-19: qty 15

## 2023-03-19 MED ORDER — MISOPROSTOL 200 MCG PO TABS
800.0000 ug | ORAL_TABLET | Freq: Once | ORAL | Status: AC
  Administered 2023-03-19: 800 ug via RECTAL

## 2023-03-19 MED ORDER — DIPHENHYDRAMINE HCL 25 MG PO CAPS: 25.0000 mg | ORAL_CAPSULE | Freq: Four times a day (QID) | ORAL | Status: DC | PRN

## 2023-03-19 NOTE — Progress Notes (Signed)
Postpartum Day  1  Subjective: 37 y.o. [id number] postpartum day #1 status post normal spontaneous vaginal delivery. She is ambulating, is tolerating po, is not voiding spontaneously.Foley just removed.  Her pain is not well controlled on PO pain medications. Her lochia is less than menses.  Objective: BP (!) 121/96 (BP Location: Left Arm) Comment: Notify Anna, RN of bp  Pulse 87   Temp 98 F (36.7 C)   Resp 18   Ht 5\' 8"  (1.727 m)   Wt 98 kg   LMP 06/30/2022   SpO2 100%   Breastfeeding Unknown   BMI 32.84 kg/m    Physical Exam:  General: alert, cooperative, and appears stated age Breasts: soft/nontender Pulm: nl effort Abdomen: soft, non-tender, active bowel sounds Uterine Fundus: firm Perineum: moderate edema, repair well approximated Lochia: appropriate DVT Evaluation: No evidence of DVT seen on physical exam. Negative Homan's sign. No cords or calf tenderness. No significant calf/ankle edema.  Recent Labs    03/19/23 0656 03/19/23 1235  HGB 10.1* 9.5*  HCT 31.9* 30.9*  WBC 15.7* 16.6*  PLT 238 231    Assessment/Plan: 37 y.o. G4P4004 postpartum day # 1  1. Continue routine postpartum care  2. Infant feeding status: formula feeding, expressed breast milk, and currently receiving IV fluids --Lactation consult PRN for breastfeeding    3. Contraception plan: bilateral tubal ligation  4. Acute blood loss anemia - clinically significant.  --Hemodynamically stable and symptomatic  --Intervention: start on oral supplementation with ferrous sulfate 325 mg  and IV iron transfusion with venofer given   5. Immunization status:   all immunizations up to date  6. Voiding trial and bladder scan completed. Patient voided 200 mL. Bladder scan 0 mL.  7. Increase pain meds from 5-10 mg of oxycodone Q6 to Percocet 7.5/325 Q4  8. Start Senna-S, Miralax, and Colace today.    Disposition: continue inpatient postpartum care    LOS: 1 day   ----- Chari Manning Certified Nurse Midwife [facility]

## 2023-03-19 NOTE — Op Note (Unsigned)
NAMEMARZELLE, [name] MEDICAL RECORD NO: [id number] ACCOUNT NO: [id number] DATE OF BIRTH: 01-May-1986 FACILITY: [facility] LOCATION: [facility] PHYSICIAN: Suzy Bouchard, MD  Operative Report   DATE OF PROCEDURE: 03/19/2023  PREOPERATIVE DIAGNOSIS:  Status post spontaneous vaginal delivery complicated by fourth-degree perineal laceration.  POSTOPERATIVE DIAGNOSIS:  Status post spontaneous vaginal delivery complicated by fourth-degree perineal laceration.  PROCEDURE:  Fourth-degree perineal repair accompanied with a third-degree repair of internal and external anal sphincter (3C).  SURGEON:  Suzy Bouchard, MD  INDICATIONS:  This is a 37 year old gravida 4, para 3, patient at 37+2 weeks when she presented to labor and delivery on 03/18/2023 with spontaneous rupture of membranes.  The patient is a type 2 diabetic in poor control with estimated fetal weight  approximately 4400 grams based on recent ultrasound.  The patient underwent a spontaneous vaginal delivery complicated by shoulder dystocia and fracture of infant right humerus.  During delivery and after delivery of the placenta, patient's perineal body  was inspected, it revealed a total third-degree laceration i.e. internal and external sphincters completely torn and a rectal tear as well.  DESCRIPTION OF PROCEDURE:  After the placenta was delivered by me an inspection of the cervix with no trailing membranes and good hemostasis noted. The fourth-degree perineal laceration was identified.  There was approximately 5 cm rectal mucosa  laceration.  The rectal mucosa was repaired with 4-0 and 3-0 Vicryl suture with a running nonlocking component, three separate suture.  Attention was directed as to not place sutures through the rectal mucosa.  Four separate running sutures were used to  reapproximate the rectal mucosa and to make sure suture lines were not under tension.  Rectal exam was done twice during the repair, which  demonstrated a good approximation of rectal mucosa.  No defects were noted.  Bladder was drained prior to  performing this procedure.  Attention was then directed to the patient's sphincter external and internal sphincter, which were completely torn i.e., grade 3C.  Capsules were identified and grasped with Allis clamps and approximated centrally.  Three  separate figure-of-eight sutures were used to reapproximate the sphincter muscles and capsule.  Good approximation of the tissue.  Good tone was noted with a repeat rectal exam.  2-0 Vicryl suture was used for this portion of the repair.  The vaginal  epithelium was then reapproximated with 2-0 Vicryl suture as well.  Crown sutures 3 separate were utilized to bring together the perineal body and the subcuticular skin was reapproximated with 4-0 Vicryl suture.  Good cosmetic effect was noted.  Good  hemostasis was noted.  A rectal exam was performed at the end, which demonstrated no defects in the rectum and good rectal tone with reapproximation of the sphincter muscles.  Estimated blood loss from this portion of the procedure approximately 50 mL. Please reference the vaginal delivery note by certified nurse midwife Andrey Campanile.  There were no complications.  The patient did receive 2 grams of IV Ancef intravenously during the  repair for surgical prophylaxis.     Xaver.Mink D: 03/19/2023 1:46:46 am T: 03/19/2023 2:10:00 am  JOB: [id number]/ [id number]

## 2023-03-19 NOTE — Anesthesia Postprocedure Evaluation (Signed)
Anesthesia Post Note  Patient: Diana [name]  Procedure(s) Performed: AN AD HOC LABOR EPIDURAL  Patient location during evaluation: Mother Baby Anesthesia Type: Epidural Level of consciousness: awake and alert Pain management: pain level controlled Vital Signs Assessment: post-procedure vital signs reviewed and stable Respiratory status: spontaneous breathing, nonlabored ventilation and respiratory function stable Cardiovascular status: stable Postop Assessment: no headache, no backache and epidural receding Anesthetic complications: no  No notable events documented.   Last Vitals:  Vitals:   03/19/23 0615 03/19/23 0749  BP: (!) 125/97 107/68  Pulse: 87 79  Resp: 20 20  Temp: 36.6 C 36.7 C  SpO2: 100% 100%    Last Pain:  Vitals:   03/19/23 0725  TempSrc:   PainSc: 5                  [name] B [name]

## 2023-03-19 NOTE — Discharge Summary (Signed)
Obstetrical Discharge Summary  Patient Name: Diana [name] DOB: 1986/07/08 MRN: [id number]  Date of Admission: 03/18/2023 Date of Delivery: 03/18/23 Delivered by: F. Rubye Oaks, CNM/D. Andrey Campanile, CNM Date of Discharge: 03/21/2023  Primary OB: [facility]  WUJ:WJXBJYN'W last menstrual period was 06/30/2022. EDC Estimated Date of Delivery: 04/06/23 Gestational Age at Delivery: [redacted]w[redacted]d   Antepartum complications:  Diabetes Type 2 CHTN AMA obesity Admitting Diagnosis: PPROM Secondary Diagnosis: NSVD, 4th degree laceration, shoulder dystocia Patient Active Problem List   Diagnosis Date Noted   Shoulder dystocia during labor and delivery 03/19/2023   NSVD (normal spontaneous vaginal delivery) 03/19/2023   Fourth degree laceration of perineum during delivery, postpartum 03/19/2023   Encounter for planned induction of labor 03/18/2023   Chronic hypertension affecting pregnancy 01/02/2023   Pre-existing type 2 diabetes mellitus during pregnancy in second trimester 01/02/2023   Vaginal bleeding in pregnancy, second trimester 10/07/2022   Multigravida of advanced maternal age in second trimester 08/30/2022   Gestational diabetes mellitus (GDM) affecting pregnancy 08/11/2015    Augmentation: AROM, Pitocin, and Cytotec Complications: Hemorrhage>1027mL Intrapartum complications/course: She arrived with PPROM and was augmented with cytotec, pitocin, and AROM. She progressed to 10/100/+2 and pushed for 2 hours with a 2.3min shoulder dystocia resolved with delivery of posterior arm. Mother obtained a 4th degree laceration, repaired by Dr. Feliberto Gottron.  Date of Delivery: 09/03/ Delivered By: Donato Schultz, CNM Delivery Type: spontaneous vaginal delivery Anesthesia: epidural Placenta: spontaneous Laceration: 4th degree, repaired by Dr. Feliberto Gottron  Episiotomy: none Newborn Data: Live born female "Okey Regal" Birth Weight: 10 lb 5.8 oz (4700 g) APGAR: 1, 7  Newborn Delivery    Birth date/time: 03/18/2023 23:56:00 Delivery type: Vaginal, Spontaneous       Postpartum Procedures: IV Venofer  Edinburgh:     03/19/2023    9:55 PM  Edinburgh Postnatal Depression Scale Screening Tool  I have been able to laugh and see the funny side of things. 0  I have looked forward with enjoyment to things. 0  I have blamed myself unnecessarily when things went wrong. 0  I have been anxious or worried for no good reason. 0  I have felt scared or panicky for no good reason. 0  Things have been getting on top of me. 0  I have been so unhappy that I have had difficulty sleeping. 0  I have felt sad or miserable. 0  I have been so unhappy that I have been crying. 0  The thought of harming myself has occurred to me. 0  Edinburgh Postnatal Depression Scale Total 0      Post partum course:  Patient had an uncomplicated postpartum course.  By time of discharge on PPD#3, her pain was controlled on oral pain medications; she had appropriate lochia and was ambulating, voiding without difficulty and tolerating regular diet.  She was deemed stable for discharge to home.    Discharge Physical Exam:  BP 108/76 (BP Location: Left Arm)   Pulse 99   Temp 98.6 F (37 C) (Oral)   Resp 18   Ht 5\' 8"  (1.727 m)   Wt 98 kg   LMP 06/30/2022   SpO2 100%   Breastfeeding Unknown   BMI 32.84 kg/m   General: NAD CV: RRR Pulm: CTABL, nl effort ABD: s/nd/nt, fundus firm and below the umbilicus Lochia: moderate Incision: c/d/i, healing well, no significant drainage, no dehiscence, no significant erythema Perineum: minimal edema, well-approximated DVT Evaluation: LE non-ttp, no evidence of DVT on exam.  Hemoglobin  Date  Value Ref Range Status  03/19/2023 9.5 (L) 12.0 - 15.0 g/dL Final   HCT  Date Value Ref Range Status  03/19/2023 30.9 (L) 36.0 - 46.0 % Final     Disposition: stable, discharge to home. Baby Feeding: breastmilk and formula Baby Disposition: home with mom  Rh  Immune globulin given: n/a, O pos Rubella vaccine given: immune Varicella vaccine given: immune Tdap vaccine given in AP or PP setting: given AP Flu vaccine given in AP or PP setting: out of season  Contraception: BTL  Prenatal Labs:  Blood type/Rh O POS  Antibody screen neg  Rubella Immune  Varicella Immune  RPR NR  HBsAg   NR  Hep C NR  HIV   NR  GC neg  Chlamydia neg  Genetic screening cfDNA negative  1 hour GTT N/a  3 hour GTT N/a  GBS Negative     Plan:  Maryln Manuel Dalpe was discharged to home in good condition. Follow-up appointment as directed.  Discharge Medications: Allergies as of 03/21/2023       Reactions   Morphine And Codeine Rash        Medication List     STOP taking these medications    insulin aspart 100 UNIT/ML FlexPen Commonly known as: NOVOLOG   insulin glargine 100 UNIT/ML injection Commonly known as: LANTUS   insulin regular 100 units/mL injection Commonly known as: NOVOLIN R       TAKE these medications    aspirin 81 MG chewable tablet Chew 81 mg by mouth daily.   benzocaine-Menthol 20-0.5 % Aero Commonly known as: DERMOPLAST Apply 1 Application topically as needed for irritation (perineal discomfort).   calcium carbonate 750 MG chewable tablet Commonly known as: TUMS EX Chew 1 tablet by mouth daily.   dibucaine 1 % Oint Commonly known as: NUPERCAINAL Place 1 Application rectally as needed for hemorrhoids.   ferrous sulfate 325 (65 FE) MG tablet Take 1 tablet (325 mg total) by mouth 2 (two) times daily with a meal.   ibuprofen 600 MG tablet Commonly known as: ADVIL Take 1 tablet (600 mg total) by mouth every 6 (six) hours as needed for mild pain or cramping.   metFORMIN 500 MG 24 hr tablet Commonly known as: GLUCOPHAGE-XR Take 500 mg by mouth 2 (two) times daily with a meal.   multivitamin-prenatal 27-0.8 MG Tabs tablet Take 1 tablet by mouth daily at 12 noon.   NIFEdipine 30 MG 24 hr tablet Commonly known  as: ADALAT CC Take 1 tablet (30 mg total) by mouth daily. What changed:  medication strength how much to take   polyethylene glycol 17 g packet Commonly known as: MIRALAX / GLYCOLAX Take 17 g by mouth daily.   senna-docusate 8.6-50 MG tablet Commonly known as: Senokot-S Take 2 tablets by mouth daily.   witch hazel-glycerin pad Commonly known as: TUCKS Apply 1 Application topically as needed for hemorrhoids.         Follow-up Information     Schermerhorn, Ihor Austin, MD Follow up on 04/01/2023.   Specialty: Obstetrics and Gynecology Why: wound check, and to schedule BTL on 9/17 @ 3 pm. Contact information: [address] [place] [address] [phone]         Chari Manning Rolla Plate, CNM Follow up in 6 week(s).   Specialty: Obstetrics Why: postpartum visit Contact information: [address] [place] [address] [phone]  SignedRoney Jaffe, CNM 03/21/2023 10:03 AM

## 2023-03-19 NOTE — Progress Notes (Signed)
   03/19/23 0000  Spiritual Encounters  Type of Visit Initial  Referral source Code page  Reason for visit Code  OnCall Visit Yes   Chaplain responded to code blue page. Patient and baby are being care for by interdisciplinary team. No further intervention required at this time. Chaplain spiritual support services available as the need arises.

## 2023-03-19 NOTE — Lactation Note (Signed)
This note was copied from a baby's chart. Lactation Consultation Note  Patient Name: Diana Clo Aldi AYTKZ'S Date: 03/19/2023 Age:39 hours  Reason for consult: Initial assessment, early term, RN request, Mother's request, baby in SCN, initiation of breastpumping   Maternal Data  This is mom's 4th baby, SVD, Mom with history of obesity, AMA, CHTN, and type 2 diabetes. Mom with post partum hemorrhage (2080 ml's blood loss). Mom is an experienced breastfeeding mother, and has breastfed previous child for 4 months.  Per chart review baby is an LGA early term in SCN post delivery due to a 3 minute shoulder dystocia with perinatal depression and a humerus fracture..  On initial assessment mom requested LC assistance with pump initiation. Per mom she plans on breastfeeding this baby and also using formula supplement as needed.  Feeding Mother's feeding choice on admission is :Breastmilk and Formula Nipple Type: Slow - flow   Lactation Tools Discussed/Used  DEBP: set-up, cleaning of parts, use, milk storage guidelines.  Interventions  Education. Recommended mom goal to pump 8 times/24 hours.   Discharge  Mom has a DEBP for home use.  Update provided to care nurse  Consult Status  Follow-up 03/20/23 inpatient   Fuller Song 03/19/2023, 4:25 PM

## 2023-03-19 NOTE — Progress Notes (Signed)
Notified Rubye Oaks, CNM of Blood Glucose of 143. Rubye Oaks CNM and Schermerhorn, MD stated to start Metformin BID during the day. Provider will place order in [facility].

## 2023-03-20 LAB — GLUCOSE, CAPILLARY
Glucose-Capillary: 98 mg/dL (ref 70–99)
Glucose-Capillary: 174 mg/dL — ABNORMAL HIGH (ref 70–99)
Glucose-Capillary: 106 mg/dL — ABNORMAL HIGH (ref 70–99)
Glucose-Capillary: 136 mg/dL — ABNORMAL HIGH (ref 70–99)
Glucose-Capillary: 111 mg/dL — ABNORMAL HIGH (ref 70–99)

## 2023-03-20 NOTE — Lactation Note (Signed)
This note was copied from a baby's chart. Lactation Consultation Note  Patient Name: Diana [name] WUJWJ'X Date: 03/20/2023 Age:39 hours Reason for consult: Follow-up assessment;Early term 37-38.6wks;Breastfeeding assistance;Other (Comment) (Baby in SCN)   Maternal Data This is mom's 4th baby, SVD, Mom with history of obesity, AMA, CHTN, and type 2 diabetes. Mom with post partum hemorrhage (2080 ml's blood loss). Mom is an experienced breastfeeding mother, and has breastfed previous child for 4 months. Per chart review baby is an LGA early term in SCN post delivery due to a 3 minute shoulder dystocia with perinatal depression and a humerus fracture.  On follow-up today mom in SCN breastfeeding baby. Per mom baby was rooting and turning her head toward mom appearing to want to breastfeed when mom was holding her. Mom independently latched baby. Per mom baby did breastfeed. Mom requested LC to check baby's latch.   Has patient been taught Hand Expression?: Yes Does the patient have breastfeeding experience prior to this delivery?: Yes How long did the patient breastfeed?: Mom breastfed previous children, for approximately 4 months.  Feeding Mother's Current Feeding Choice: Breast Milk and Formula Nipple Type: Slow - flow Provided mom with tips and strategies to maximize position and latch technique. Baby with upper and lower lip rolled inward. Mom was able to independently correct baby's latch once instructed. LATCH Score Baby had been breastfeeding prior to [facility] arriving. Baby was predominantly sucking non-nutritively during the 10 minutes LC observed.  Latch: Grasps breast easily, tongue down, lips flanged, rhythmical sucking. (non nutritive sucking)  Audible Swallowing: None (Per mom's report baby was breastfeeding when baby initially latched and began the feeding.)  Type of Nipple: Everted at rest and after stimulation  Comfort (Breast/Nipple): Filling, red/small blisters or  bruises, mild/mod discomfort (Corrected upper and lower lip latch. After latch correction mom reports imroved comfort.)  Hold (Positioning): Assistance needed to correctly position infant at breast and maintain latch. (Rolled baby as a unit keeping right arm aligned so baby was tummy to tummy with mom)  LATCH Score: 6   Lactation Tools Discussed/Used Tools: Pump Breast pump type: Double-Electric Breast Pump Pump Education: Setup, frequency, and cleaning (Per mom she was exhausted last night and slept and did not pump. Provided mom tips to encourage night time pumping to establish milk supply.) Reason for Pumping: Baby in SCN Pumping frequency: 8 times/24 hours  Interventions Interventions: Assisted with latch;Adjust position;Support pillows;Education  Discharge Pump: Personal (Will provide mom with temporary use of loaner pump for 14 days as mom had 2080 ml post partum hemorrhage and hospital grade pump will provide best opportunity to maximize milk production.) [facility] loaner pump # [id number]  Consult Status Consult Status: Follow-up Date: 03/21/23 Follow-up type: In-patient  Update provided to care nurse.  Fuller Song 03/20/2023, 12:23 PM

## 2023-03-20 NOTE — Clinical Social Work Note (Signed)
Clinical Social Work Assessment  Patient Details  Name: Diana [name] MRN: [id number] Date of Birth: Nov 29, 1985  Date of referral:  03/20/23               Reason for consult:  Other (Comment Required) (Parental support critically ill baby)                Permission sought to share information with:  Case Manager Permission granted to share information::     Name::        Agency::     Relationship::  Husband at bedside  Contact Information:     Housing/Transportation Living arrangements for the past 2 months:  Single Family Home Source of Information:  Patient Patient Interpreter Needed:  None Criminal Activity/Legal Involvement Pertinent to Current Situation/Hospitalization:    Significant Relationships:  Spouse Lives with:  Self, Spouse Do you feel safe going back to the place where you live?    Need for family participation in patient care:  Yes (Comment)  Care giving concerns:     Social Worker assessment / plan:   Cm received consult for  Parental support critically ill baby  Mother and Father are at bedside and in good spirits. Mother stated that baby is in the special care nursery.Parents are appropriate and excited about baby. Mother f/u with [facility] for obgyn. Mother is going to make appointment for baby for pediatrician at [facility] when d/c. No history of mental health or medications at this time. They have everything they need for baby when ready to go home such as crib, car seat, bassinet, diapers and clothes. Cm went over SIDS education and PPD education. Mother and Father have no additional concerns at this time. Cm will continue to follow for toc needs.   Employment status:    Health and safety inspector:    PT Recommendations:    Information / Referral to community resources:     Patient/Family's Response to care:  happy after deliverly  Patient/Family's Understanding of and Emotional Response to Diagnosis, Current Treatment, and Prognosis:  yes  understanding of care  Emotional Assessment Appearance:  Appears stated age Attitude/Demeanor/Rapport:    Affect (typically observed):  Calm Orientation:  Oriented to Self, Oriented to Place, Oriented to  Time, Oriented to Situation Alcohol / Substance use:  Never Used Psych involvement (Current and /or in the community):     Discharge Needs  Concerns to be addressed:    Readmission within the last 30 days:    Current discharge risk:  None Barriers to Discharge:  No Barriers Identified   Kreg Shropshire, RN 03/20/2023, 9:34 AM

## 2023-03-20 NOTE — Progress Notes (Addendum)
Post Partum Day 2  Subjective: Pt is still unsteady on her feet when getting out of bed.  Pain is improving but still needing Percocet. Ambulating without difficulty, pain managed with PO meds, tolerating regular diet, and voiding without difficulty.   No fever/chills, chest pain, shortness of breath, nausea/vomiting, or leg pain. No nipple or breast pain. No headache, visual changes, or RUQ/epigastric pain.  Objective: BP 95/60 (BP Location: Left Arm)   Pulse 85   Temp 97.8 F (36.6 C) (Oral)   Resp 16   Ht 5\' 8"  (1.727 m)   Wt 98 kg   LMP 06/30/2022   SpO2 99%   Breastfeeding Unknown   BMI 32.84 kg/m    Physical Exam:  General: alert and cooperative Breasts: soft/nontender CV: RRR Pulm: nl effort Abdomen: soft, non-tender Uterine Fundus: firm Incision: n/a Perineum: minimal edema, repair well approximated Lochia: appropriate DVT Evaluation: No evidence of DVT seen on physical exam. Edinburgh:     03/19/2023    9:55 PM  Inocente Salles Postnatal Depression Scale Screening Tool  I have been able to laugh and see the funny side of things. 0  I have looked forward with enjoyment to things. 0  I have blamed myself unnecessarily when things went wrong. 0  I have been anxious or worried for no good reason. 0  I have felt scared or panicky for no good reason. 0  Things have been getting on top of me. 0  I have been so unhappy that I have had difficulty sleeping. 0  I have felt sad or miserable. 0  I have been so unhappy that I have been crying. 0  The thought of harming myself has occurred to me. 0  Edinburgh Postnatal Depression Scale Total 0     Recent Labs    03/19/23 0656 03/19/23 1235  HGB 10.1* 9.5*  HCT 31.9* 30.9*  WBC 15.7* 16.6*  PLT 238 231    Assessment/Plan: 36 y.o. G4P4004 postpartum day # 2  1. Continue routine postpartum care  2. Infant feeding status: breast feeding -Lactation consult PRN for breastfeeding   3. Contraception plan: bilateral tubal  ligation  4. Acute blood loss anemia - clinically significant.  -Hemodynamically stable and asymptomatic -Intervention: continue on oral supplementation with ferrous sulfate 325  5. Immunization status:   all immunizations up to date  6. Pain: continue monitoring pain control and treat as needed  7. Baby in SCN   8. DM: Continue Metformin and checking glucose routine (range: 98-174)  9. CHTN: Continue Procardia 30mg  every day (95-115/60-92)  Disposition: Continue inpatient postpartum care     LOS: 2 days   Vada Swift, CNM 03/20/2023, 8:28 AM

## 2023-03-21 MED ORDER — IBUPROFEN 600 MG PO TABS: 600.0000 mg | ORAL_TABLET | Freq: Four times a day (QID) | ORAL | 1 refills | Status: AC | PRN

## 2023-03-21 MED ORDER — BENZOCAINE-MENTHOL 20-0.5 % EX AERO: 1.0000 | INHALATION_SPRAY | CUTANEOUS | Status: AC | PRN

## 2023-03-21 MED ORDER — OXYCODONE-ACETAMINOPHEN 7.5-325 MG PO TABS: 1.0000 | ORAL_TABLET | ORAL | 0 refills | Status: AC | PRN

## 2023-03-21 MED ORDER — FERROUS SULFATE 325 (65 FE) MG PO TABS: 325.0000 mg | ORAL_TABLET | Freq: Two times a day (BID) | ORAL | Status: AC

## 2023-03-21 MED ORDER — POLYETHYLENE GLYCOL 3350 17 G PO PACK: 17.0000 g | PACK | Freq: Every day | ORAL | Status: AC

## 2023-03-21 MED ORDER — SENNOSIDES-DOCUSATE SODIUM 8.6-50 MG PO TABS: 2.0000 | ORAL_TABLET | Freq: Every day | ORAL | Status: AC

## 2023-03-21 MED ORDER — NIFEDIPINE ER 30 MG PO TB24: 30.0000 mg | ORAL_TABLET | Freq: Every day | ORAL | 3 refills | Status: AC

## 2023-03-21 MED ORDER — DIBUCAINE (PERIANAL) 1 % EX OINT: 1.0000 | TOPICAL_OINTMENT | CUTANEOUS | Status: AC | PRN

## 2023-03-21 MED ORDER — WITCH HAZEL-GLYCERIN EX PADS: 1.0000 | MEDICATED_PAD | CUTANEOUS | Status: AC | PRN

## 2023-03-21 NOTE — Lactation Note (Signed)
This note was copied from a baby's chart. Lactation Consultation Note  Patient Name: Diana [name] Date: 03/21/2023 Age:39 hours Reason for consult: Follow-up assessment;Early term 37-38.6wks;Breastfeeding assistance;RN request;Other (Comment) (baby in SCN)   Maternal Data This is mom's 4th baby, SVD, Mom with history of obesity, AMA, CHTN, and type 2 diabetes. Mom with post partum hemorrhage (2080 ml's blood loss). Mom is an experienced breastfeeding mother, and has breastfed previous child for 4 months. Per chart review baby is an LGA early term in SCN post delivery due to a 3 minute shoulder dystocia with perinatal depression and a humerus fracture.   On follow-up today provided mom with [facility] loaner pump for 2 weeks. Although mom has a personal pump LC provided loaner pump to mom with post partum hemorrhage(2,080 ml's) to maximize stimulation to assist mom with establishing her milk supply. Per mom baby latched and breastfed well during the night. Mom was at baby's bedside to breastfeed when [facility] came to follow-up. Mom independently positioning and latching baby. Has patient been taught Hand Expression?: Yes Does the patient have breastfeeding experience prior to this delivery?: Yes How long did the patient breastfeed?: Mom breastfed previous children, for approximately 4 months.  Feeding Mother's Current Feeding Choice: Breast Milk and Formula Mom was at baby's bedside to breastfeed when [facility] came to follow-up. Mom independently positioned and latched baby well. Baby breastfed for 6 minutes and became sleepy. Mom burped the baby and attempted to relatch the baby. Baby would not latch and was asleep in mom's arms. Per mom she was going to let baby rest some and try again.   LATCH Score Latch: Grasps breast easily, tongue down, lips flanged, rhythmical sucking.  Audible Swallowing: A few with stimulation  Type of Nipple: Everted at rest and after stimulation  Comfort  (Breast/Nipple): Soft / non-tender  Hold (Positioning): No assistance needed to correctly position infant at breast.  LATCH Score: 9   Lactation Tools Discussed/Used Reason for Pumping: Baby in SCN Pumping frequency: Recommended mom continue to pump at least 8 times/24 hours and to post pump after breastfeeding sessions.  Interventions Interventions: Adjust position;Support pillows;Hand express;Education  Discharge Discharge Education: Engorgement and breast care Pump:  [facility] loaner pump provided. Mom has personal DEBP.)  Consult Status Consult Status: Follow-up Date: 03/22/23 Follow-up type: In-patient  Update provided to care nurse.  Fuller Song 03/21/2023, 1:04 PM

## 2023-03-21 NOTE — Progress Notes (Signed)
Pt discharged, infant in SCN.  Discharge instructions, prescriptions and follow up appointment given to and reviewed with pt. Pt verbalized understanding. Escorted out by auxillary. 

## 2023-03-21 NOTE — Discharge Instructions (Signed)
Vaginal Delivery, Care After Refer to this sheet in the next few weeks. These discharge instructions provide you with information on caring for yourself after delivery. Your caregiver may also give you specific instructions. Your treatment has been planned according to the most current medical practices available, but problems sometimes occur. Call your caregiver if you have any problems or questions after you go home. HOME CARE INSTRUCTIONS Take over-the-counter or prescription medicines only as directed by your caregiver or pharmacist. Do not drink alcohol, especially if you are breastfeeding or taking medicine to relieve pain. Do not smoke tobacco. Continue to use good perineal care. Good perineal care includes: Wiping your perineum from back to front Keeping your perineum clean. You can do sitz baths twice a day, to help keep this area clean Do not use tampons, douche or have sex until your caregiver says it is okay. Shower only and avoid sitting in submerged water, aside from sitz baths Wear a well-fitting bra that provides breast support. Eat healthy foods. Drink enough fluids to keep your urine clear or pale yellow. Eat high-fiber foods such as whole grain cereals and breads, brown rice, beans, and fresh fruits and vegetables every day. These foods may help prevent or relieve constipation. Avoid constipation with high fiber foods or medications, such as miralax or metamucil Follow your caregiver's recommendations regarding resumption of activities such as climbing stairs, driving, lifting, exercising, or traveling. Talk to your caregiver about resuming sexual activities. Resumption of sexual activities is dependent upon your risk of infection, your rate of healing, and your comfort and desire to resume sexual activity. Try to have someone help you with your household activities and your newborn for at least a few days after you leave the hospital. Rest as much as possible. Try to rest or  take a nap when your newborn is sleeping. Increase your activities gradually. Keep all of your scheduled postpartum appointments. It is very important to keep your scheduled follow-up appointments. At these appointments, your caregiver will be checking to make sure that you are healing physically and emotionally. SEEK MEDICAL CARE IF:  You are passing large clots from your vagina. Save any clots to show your caregiver. You have a foul smelling discharge from your vagina. You have trouble urinating. You are urinating frequently. You have pain when you urinate. You have a change in your bowel movements. You have increasing redness, pain, or swelling near your vaginal incision (episiotomy) or vaginal tear. You have pus draining from your episiotomy or vaginal tear. Your episiotomy or vaginal tear is separating. You have painful, hard, or reddened breasts. You have a severe headache. You have blurred vision or see spots. You feel sad or depressed. You have thoughts of hurting yourself or your newborn. You have questions about your care, the care of your newborn, or medicines. You are dizzy or light-headed. You have a rash. You have nausea or vomiting. You were breastfeeding and have not had a menstrual period within 12 weeks after you stopped breastfeeding. You are not breastfeeding and have not had a menstrual period by the 12th week after delivery. You have a fever. SEEK IMMEDIATE MEDICAL CARE IF:  You have persistent pain. You have chest pain. You have shortness of breath. You faint. You have leg pain. You have stomach pain. Your vaginal bleeding saturates two or more sanitary pads in 1 hour. MAKE SURE YOU:  Understand these instructions. Will watch your condition. Will get help right away if you are not doing well or   get worse. Document Released: 06/28/2000 Document Revised: 11/15/2013 Document Reviewed: 02/26/2012 ExitCare Patient Information 2015 [employer]. This  information is not intended to replace advice given to you by your health care provider. Make sure you discuss any questions you have with your health care provider.  Sitz Bath A sitz bath is a warm water bath taken in the sitting position. The water covers only the hips and butt (buttocks). We recommend using one that fits in the toilet, to help with ease of use and cleanliness. It may be used for either healing or cleaning purposes. Sitz baths are also used to relieve pain, itching, or muscle tightening (spasms). The water may contain medicine. Moist heat will help you heal and relax.  HOME CARE  Take 3 to 4 sitz baths a day. Fill the bathtub half-full with warm water. Sit in the water and open the drain a little. Turn on the warm water to keep the tub half-full. Keep the water running constantly. Soak in the water for 15 to 20 minutes. After the sitz bath, pat the affected area dry. GET HELP RIGHT AWAY IF: You get worse instead of better. Stop the sitz baths if you get worse. MAKE SURE YOU: Understand these instructions. Will watch your condition. Will get help right away if you are not doing well or get worse. Document Released: 08/08/2004 Document Revised: 03/25/2012 Document Reviewed: 10/29/2010 Exit[employer] Patient Information 2015 [employer]. This information is not intended to replace advice given to you by your health care provider. Make sure you discuss any questions you have with your health care provider.   

## 2023-03-24 ENCOUNTER — Telehealth: Payer: Self-pay

## 2023-03-24 NOTE — Telephone Encounter (Signed)
[facility]- Discharge Call Backs-Left Voicemail about the following below. 1-Do you have any questions or concerns about yourself as you heal? 2-Any concerns or questions about your baby? Is your baby eating, peeing,pooping well? 3 Reviewed ABC's of safe sleep. 4-How was your stay at the hospital? 5- Did our team work together to care for you? You should be receiving a survey in the mail soon.   We would really appreciate it if you could fill that out for us and return it in the mail.  We value the feedback to make improvements and continue the great work we do.   If you have any questions please feel free to call me back at [phone]  

## 2023-03-24 NOTE — Group Note (Deleted)

## 2023-04-06 ENCOUNTER — Inpatient Hospital Stay: Admit: 2023-04-06 | Payer: Self-pay

## 2023-04-09 ENCOUNTER — Other Ambulatory Visit: Payer: Self-pay | Admitting: Obstetrics and Gynecology

## 2023-04-10 NOTE — H&P (Signed)
Ms. Hemple is a 37 y.o. female here for Post Operative Visit . 2 weeks out from a 4th degree laceration   baby doing well .Marland Kitchen She states BM are normal . No significant pain .    Pt is interested in a BTL scheduled 05/02/23 .    Past Medical History:  has a past medical history of Gestational diabetes mellitus (HHS-HCC), Hypertension, and Uncontrolled type 2 diabetes mellitus with hyperglycemia (CMS/HHS-HCC) (05/22/2022).  Past Surgical History:  has no past surgical history on file. Family History: family history includes Diabetes in her father and mother; Kidney disease in her paternal grandmother; Stroke in her paternal grandmother. Social History:  reports that she has never smoked. She has never used smokeless tobacco. She reports that she does not drink alcohol and does not use drugs. OB/GYN History:  OB History       Gravida  4   Para  4   Term  4   Preterm  0   AB      Living  4        SAB      IAB      Ectopic      Molar      Multiple      Live Births  4             Allergies: is allergic to morphine and other. Medications:  Current Medications    Current Outpatient Medications:    blood glucose diagnostic test strip, 1 each (1 strip total) 4 (four) times daily Use as instructed., Disp: 100 strip, Rfl: 6   blood glucose meter kit, as directed, Disp: 1 each, Rfl: 0   blood-glucose sensor, Use 1 each every 14 (fourteen) days, Disp: 6 each, Rfl: 1   metFORMIN (GLUCOPHAGE-XR) 500 MG XR tablet, Take 1 tablet (500 mg total) by mouth 2 (two) times daily, Disp: 60 tablet, Rfl: 11   NIFEdipine (PROCARDIA-XL) 90 MG (OSM) XL tablet, Take 1 tablet (90 mg total) by mouth once daily, Disp: 30 tablet, Rfl: 11   insulin GLARGINE (LANTUS SOLOSTAR U-100 INSULIN) pen injector (concentration 100 units/mL), Inject 30 units in the morning and  35 units at daily at night (Patient not taking: Reported on 04/01/2023), Disp: 15 mL, Rfl: 12   insulin REGULAR (HUMULIN R) injection  (concentration 100 units/mL), Inject 22 units every morning with first bite of food and inject 16 units every night (Patient not taking: Reported on 04/01/2023), Disp: 10 mL, Rfl: 12   insulin syringe-needle U-100 (INSULIN SYRINGE) 1 mL 29 gauge x 1/2" syringe, Use as directed (Patient not taking: Reported on 04/01/2023), Disp: 100 each, Rfl: 12   insulin syringe-needle U-100 0.3 mL 31 gauge x 5/16" syringe, Use as directed (Patient not taking: Reported on 04/01/2023), Disp: 100 each, Rfl: 12   lancets, Use 1 each 4 (four) times daily Use as instructed. (Patient not taking: Reported on 04/01/2023), Disp: 100 each, Rfl: 6   norgestimate-ethinyl estradioL (SPRINTEC 0.25/35, 28,) 0.25-35 mg-mcg tablet, Take 1 tablet by mouth once daily for 84 days, Disp: 28 tablet, Rfl: 3   promethazine (PHENERGAN) 25 MG tablet, Take 1 tablet (25 mg total) by mouth every 6 (six) hours as needed for Nausea (Patient not taking: Reported on 10/28/2022), Disp: 30 tablet, Rfl: 1     Review of Systems: General:                      No fatigue or weight loss Eyes:  No vision changes Ears:                            No hearing difficulty Respiratory:                No cough or shortness of breath Pulmonary:                  No asthma or shortness of breath Cardiovascular:           No chest pain, palpitations, dyspnea on exertion Gastrointestinal:          No abdominal bloating, chronic diarrhea, constipations, masses, pain or hematochezia Genitourinary:             No hematuria, dysuria, abnormal vaginal discharge, pelvic pain, Menometrorrhagia Lymphatic:                   No swollen lymph nodes Musculoskeletal:No muscle weakness Neurologic:                  No extremity weakness, syncope, seizure disorder Psychiatric:                  No history of depression, delusions or suicidal/homicidal ideation      Exam:       Vitals:    04/15/23 1516  BP: (!) 141/84  Pulse: 85      Body mass index is  29.23 kg/m.   WDWN / black female in NAD   Lungs: CTA  CV : RRR without murmur     Pelvic: tanner stage 5 ,  External genitalia: vulva /labia no lesions Urethra: no prolapse Vagina: normal physiologic d/c . Tissues are well approximated    Impression:    Elective L/S BTL  either falope rings or cautery       Plans :  L/S BTL   Risks of the procedure discussed   See [facility] notes .  Failure 1:300/yr

## 2023-04-24 ENCOUNTER — Inpatient Hospital Stay: Admission: RE | Admit: 2023-04-24 | Payer: 59 | Source: Ambulatory Visit

## 2023-04-25 ENCOUNTER — Other Ambulatory Visit: Payer: Self-pay

## 2023-04-25 ENCOUNTER — Encounter
Admission: RE | Admit: 2023-04-25 | Discharge: 2023-04-25 | Disposition: A | Discharge status: Home / Self Care | Payer: 59 | Source: Ambulatory Visit | Attending: Obstetrics and Gynecology | Admitting: Obstetrics and Gynecology

## 2023-04-25 VITALS — Ht 69.0 in | Wt 193.0 lb

## 2023-04-25 DIAGNOSIS — Z01818 Encounter for other preprocedural examination: Secondary | ICD-10-CM

## 2023-04-25 DIAGNOSIS — O10919 Unspecified pre-existing hypertension complicating pregnancy, unspecified trimester: Secondary | ICD-10-CM

## 2023-04-25 DIAGNOSIS — O24112 Pre-existing diabetes mellitus, type 2, in pregnancy, second trimester: Secondary | ICD-10-CM

## 2023-04-25 NOTE — Patient Instructions (Addendum)
Your procedure is scheduled on: Friday 05/02/23 To find out your arrival time, please call [phone] between 1PM - 3PM on:   Thursday 05/01/23 Report to the Registration Desk on the 1st floor of the [facility]. Free Valet parking is available.  If your arrival time is 6:00 am, do not arrive before that time as the [facility] entrance doors do not open until 6:00 am.  REMEMBER: Instructions that are not followed completely may result in serious medical risk, up to and including death; or upon the discretion of your surgeon and anesthesiologist your surgery may need to be rescheduled.  Do not eat food after midnight the night before surgery.  No gum chewing or hard candies.  You may however, drink CLEAR liquids up to 2 hours before you are scheduled to arrive for your surgery. Do not drink anything within 2 hours of your scheduled arrival time.  Clear liquids include: - water  - gatorade (not RED colors)  Type 1 and Type 2 diabetics should only drink water.  In addition, your doctor has ordered for you to drink the provided:    Gatorade G2 Drinking this carbohydrate drink up to two hours before surgery helps to reduce insulin resistance and improve patient outcomes. Please complete drinking 2 hours before scheduled arrival time.  One week prior to surgery: Stop Anti-inflammatories (NSAIDS) such as Advil, Aleve, Ibuprofen, Motrin, Naproxen, Naprosyn and Aspirin based products such as Excedrin, Goody's Powder, BC Powder. You may however, continue to take Tylenol if needed for pain up until the day of surgery.  Stop ANY OVER THE COUNTER supplements until after surgery.  Continue taking all prescribed medications with the exception: Hold Metformin for 2 days. Last dose will be Tuesday evening 04/29/23.  TAKE ONLY THESE MEDICATIONS THE MORNING OF SURGERY WITH A SIP OF WATER:  NIFEdipine (ADALAT CC) 30 MG 24 hr tablet   No Alcohol for 24 hours before or after surgery.  No  Smoking including e-cigarettes for 24 hours before surgery.  No chewable tobacco products for at least 6 hours before surgery.  No nicotine patches on the day of surgery.  Do not use any "recreational" drugs for at least a week (preferably 2 weeks) before your surgery.  Please be advised that the combination of cocaine and anesthesia may have negative outcomes, up to and including death. If you test positive for cocaine, your surgery will be cancelled.  On the morning of surgery brush your teeth with toothpaste and water, you may rinse your mouth with mouthwash if you wish. Do not swallow any toothpaste or mouthwash.  Use CHG Soap or wipes as directed on instruction sheet.  Do not wear lotions, powders, or perfumes.   Do not shave body hair from the neck down 48 hours before surgery.  Wear comfortable clothing (specific to your surgery type) to the hospital.  Do not wear jewelry, make-up, hairpins, clips or nail polish.  For welded (permanent) jewelry: bracelets, anklets, waist bands, etc.  Please have this removed prior to surgery.  If it is not removed, there is a chance that hospital personnel will need to cut it off on the day of surgery. Contact lenses, hearing aids and dentures may not be worn into surgery.  Do not bring valuables to the hospital. [facility] is not responsible for any missing/lost belongings or valuables.   Notify your doctor if there is any change in your medical condition (cold, fever, infection).  If you are being discharged the day  of surgery, you will not be allowed to drive home. You will need a responsible individual to drive you home and stay with you for 24 hours after surgery.   If you are taking public transportation, you will need to have a responsible individual with you.  If you are being admitted to the hospital overnight, leave your suitcase in the car. After surgery it may be brought to your room.  In case of increased patient census, it may  be necessary for you, the patient, to continue your postoperative care in the Same Day Surgery department.  After surgery, you can help prevent lung complications by doing breathing exercises.  Take deep breaths and cough every 1-2 hours. Your doctor may order a device called an Incentive Spirometer to help you take deep breaths. When coughing or sneezing, hold a pillow firmly against your incision with both hands. This is called "splinting." Doing this helps protect your incision. It also decreases belly discomfort.  Surgery Visitation Policy:  Patients undergoing a surgery or procedure may have two family members or support persons with them as long as the person is not COVID-19 positive or experiencing its symptoms.   Inpatient Visitation:    Visiting hours are 7 a.m. to 8 p.m. Up to four visitors are allowed at one time in a patient room. The visitors may rotate out with other people during the day. One designated support person (adult) may remain overnight.  Please call the Pre-admissions Testing Dept. at [phone] if you have any questions about these instructions.     Preparing for Surgery with CHLORHEXIDINE GLUCONATE (CHG) Soap  Chlorhexidine Gluconate (CHG) Soap  o An antiseptic cleaner that kills germs and bonds with the skin to continue killing germs even after washing  o Used for showering the night before surgery and morning of surgery  Before surgery, you can play an important role by reducing the number of germs on your skin.  CHG (Chlorhexidine gluconate) soap is an antiseptic cleanser which kills germs and bonds with the skin to continue killing germs even after washing.  Please do not use if you have an allergy to CHG or antibacterial soaps. If your skin becomes reddened/irritated stop using the CHG.  1. Shower the NIGHT BEFORE SURGERY and the MORNING OF SURGERY with CHG soap.  2. If you choose to wash your hair, wash your hair first as usual with your normal  shampoo.  3. After shampooing, rinse your hair and body thoroughly to remove the shampoo.  4. Use CHG as you would any other liquid soap. You can apply CHG directly to the skin and wash gently with a scrungie or a clean washcloth.  5. Apply the CHG soap to your body only from the neck down. Do not use on open wounds or open sores. Avoid contact with your eyes, ears, mouth, and genitals (private parts). Wash face and genitals (private parts) with your normal soap.  6. Wash thoroughly, paying special attention to the area where your surgery will be performed.  7. Thoroughly rinse your body with warm water.  8. Do not shower/wash with your normal soap after using and rinsing off the CHG soap.  9. Pat yourself dry with a clean towel.  10. Wear clean pajamas to bed the night before surgery.  12. Place clean sheets on your bed the night of your first shower and do not sleep with pets.  13. Shower again with the CHG soap on the day of surgery prior to  arriving at the hospital.  14. Do not apply any deodorants/lotions/powders.  15. Please wear clean clothes to the hospital.

## 2023-04-28 ENCOUNTER — Encounter
Admission: RE | Admit: 2023-04-28 | Discharge: 2023-04-28 | Disposition: A | Discharge status: Home / Self Care | Payer: 59 | Source: Ambulatory Visit | Attending: Obstetrics and Gynecology | Admitting: Obstetrics and Gynecology

## 2023-04-28 ENCOUNTER — Encounter: Payer: Self-pay | Admitting: Urgent Care

## 2023-04-28 DIAGNOSIS — Z01810 Encounter for preprocedural cardiovascular examination: Secondary | ICD-10-CM | POA: Diagnosis not present

## 2023-04-28 DIAGNOSIS — Z01818 Encounter for other preprocedural examination: Secondary | ICD-10-CM | POA: Diagnosis present

## 2023-04-28 DIAGNOSIS — O10919 Unspecified pre-existing hypertension complicating pregnancy, unspecified trimester: Secondary | ICD-10-CM | POA: Diagnosis not present

## 2023-04-28 DIAGNOSIS — Z3A00 Weeks of gestation of pregnancy not specified: Secondary | ICD-10-CM | POA: Insufficient documentation

## 2023-04-28 DIAGNOSIS — Z01812 Encounter for preprocedural laboratory examination: Secondary | ICD-10-CM

## 2023-04-28 DIAGNOSIS — Z302 Encounter for sterilization: Secondary | ICD-10-CM

## 2023-04-28 LAB — CBC
HCT: 35.2 % — ABNORMAL LOW (ref 36.0–46.0)
Hemoglobin: 10.8 g/dL — ABNORMAL LOW (ref 12.0–15.0)
MCH: 21.5 pg — ABNORMAL LOW (ref 26.0–34.0)
MCHC: 30.7 g/dL (ref 30.0–36.0)
MCV: 70 fL — ABNORMAL LOW (ref 80.0–100.0)
Platelets: 313 10*3/uL (ref 150–400)
RBC: 5.03 MIL/uL (ref 3.87–5.11)
RDW: 16.8 % — ABNORMAL HIGH (ref 11.5–15.5)
WBC: 6.7 10*3/uL (ref 4.0–10.5)
nRBC: 0 % (ref 0.0–0.2)

## 2023-04-28 LAB — BASIC METABOLIC PANEL
Anion gap: 10 (ref 5–15)
BUN: 14 mg/dL (ref 6–20)
CO2: 23 mmol/L (ref 22–32)
Calcium: 8.7 mg/dL — ABNORMAL LOW (ref 8.9–10.3)
Chloride: 103 mmol/L (ref 98–111)
Creatinine, Ser: 0.86 mg/dL (ref 0.44–1.00)
GFR, Estimated: >60 mL/min (ref >60)
Glucose, Bld: 216 mg/dL — ABNORMAL HIGH (ref 70–99)
Potassium: 3.9 mmol/L (ref 3.5–5.1)
Sodium: 136 mmol/L (ref 135–145)

## 2023-05-02 DIAGNOSIS — Z01818 Encounter for other preprocedural examination: Secondary | ICD-10-CM

## 2023-05-07 NOTE — Progress Notes (Signed)
Call to patient to verify surgery date scheduled for this Friday, October 25 and to see if she had any questions about her instructions. Patient has no questions, understands previous instructions given and has transportation for discharge after surgery.

## 2023-05-08 MED ORDER — ACETAMINOPHEN 500 MG PO TABS
1000.0000 mg | ORAL_TABLET | ORAL | Status: AC
  Administered 2023-05-09: 1000 mg via ORAL

## 2023-05-08 MED ORDER — ORAL CARE MOUTH RINSE: 15.0000 mL | Freq: Once | OROMUCOSAL | Status: AC

## 2023-05-08 MED ORDER — SODIUM CHLORIDE 0.9 % IV SOLN: INTRAVENOUS | Status: DC

## 2023-05-08 MED ORDER — POVIDONE-IODINE 10 % EX SWAB
2.0000 | Freq: Once | CUTANEOUS | Status: AC
  Administered 2023-05-09: 2 via TOPICAL

## 2023-05-08 MED ORDER — CHLORHEXIDINE GLUCONATE 0.12 % MT SOLN
15.0000 mL | Freq: Once | OROMUCOSAL | Status: AC
  Administered 2023-05-09: 15 mL via OROMUCOSAL

## 2023-05-08 MED ORDER — GABAPENTIN 300 MG PO CAPS
300.0000 mg | ORAL_CAPSULE | ORAL | Status: AC
  Administered 2023-05-09: 300 mg via ORAL

## 2023-05-09 ENCOUNTER — Ambulatory Visit
Admission: RE | Admit: 2023-05-09 | Discharge: 2023-05-09 | Disposition: A | Discharge status: Home / Self Care | Payer: 59 | Source: Ambulatory Visit | Attending: Obstetrics and Gynecology | Admitting: Obstetrics and Gynecology

## 2023-05-09 ENCOUNTER — Other Ambulatory Visit: Payer: Self-pay

## 2023-05-09 ENCOUNTER — Encounter: Payer: Self-pay | Admitting: Obstetrics and Gynecology

## 2023-05-09 ENCOUNTER — Encounter
Admission: RE | Disposition: A | Discharge status: Home / Self Care | Payer: Self-pay | Source: Ambulatory Visit | Attending: Obstetrics and Gynecology

## 2023-05-09 ENCOUNTER — Ambulatory Visit: Payer: 59 | Admitting: Urgent Care

## 2023-05-09 ENCOUNTER — Ambulatory Visit: Payer: 59 | Admitting: Anesthesiology

## 2023-05-09 DIAGNOSIS — Z01812 Encounter for preprocedural laboratory examination: Secondary | ICD-10-CM

## 2023-05-09 DIAGNOSIS — Z01818 Encounter for other preprocedural examination: Secondary | ICD-10-CM

## 2023-05-09 DIAGNOSIS — Z79899 Other long term (current) drug therapy: Secondary | ICD-10-CM | POA: Diagnosis not present

## 2023-05-09 DIAGNOSIS — Z302 Encounter for sterilization: Secondary | ICD-10-CM | POA: Diagnosis present

## 2023-05-09 DIAGNOSIS — Z794 Long term (current) use of insulin: Secondary | ICD-10-CM | POA: Insufficient documentation

## 2023-05-09 DIAGNOSIS — I10 Essential (primary) hypertension: Secondary | ICD-10-CM | POA: Insufficient documentation

## 2023-05-09 DIAGNOSIS — Z7984 Long term (current) use of oral hypoglycemic drugs: Secondary | ICD-10-CM | POA: Insufficient documentation

## 2023-05-09 DIAGNOSIS — E119 Type 2 diabetes mellitus without complications: Secondary | ICD-10-CM | POA: Diagnosis not present

## 2023-05-09 DIAGNOSIS — O24112 Pre-existing diabetes mellitus, type 2, in pregnancy, second trimester: Secondary | ICD-10-CM

## 2023-05-09 HISTORY — PX: LAPAROSCOPIC TUBAL LIGATION: SHX1937

## 2023-05-09 HISTORY — DX: Other specified postprocedural states: Z98.890

## 2023-05-09 LAB — POCT PREGNANCY, URINE: Preg Test, Ur: NEGATIVE

## 2023-05-09 LAB — GLUCOSE, CAPILLARY
Glucose-Capillary: 223 mg/dL — ABNORMAL HIGH (ref 70–99)
Glucose-Capillary: 181 mg/dL — ABNORMAL HIGH (ref 70–99)

## 2023-05-09 LAB — TYPE AND SCREEN
ABO/RH(D): O POS
Antibody Screen: NEGATIVE

## 2023-05-09 SURGERY — LIGATION, FALLOPIAN TUBE, LAPAROSCOPIC
Anesthesia: General | Site: Abdomen | Laterality: Bilateral

## 2023-05-09 MED ORDER — KETOROLAC TROMETHAMINE 30 MG/ML IJ SOLN
INTRAMUSCULAR | Status: AC
  Filled 2023-05-09: qty 1

## 2023-05-09 MED ORDER — CHLORHEXIDINE GLUCONATE 0.12 % MT SOLN
OROMUCOSAL | Status: AC
  Filled 2023-05-09: qty 15

## 2023-05-09 MED ORDER — DEXAMETHASONE SODIUM PHOSPHATE 10 MG/ML IJ SOLN
INTRAMUSCULAR | Status: DC | PRN
  Administered 2023-05-09: 5 mg via INTRAVENOUS

## 2023-05-09 MED ORDER — DEXAMETHASONE SODIUM PHOSPHATE 10 MG/ML IJ SOLN
INTRAMUSCULAR | Status: AC
  Filled 2023-05-09: qty 1

## 2023-05-09 MED ORDER — ROCURONIUM BROMIDE 100 MG/10ML IV SOLN
INTRAVENOUS | Status: DC | PRN
  Administered 2023-05-09: 10 mg via INTRAVENOUS
  Administered 2023-05-09: 50 mg via INTRAVENOUS

## 2023-05-09 MED ORDER — MIDAZOLAM HCL 2 MG/2ML IJ SOLN
INTRAMUSCULAR | Status: DC | PRN
  Administered 2023-05-09: 2 mg via INTRAVENOUS

## 2023-05-09 MED ORDER — INSULIN ASPART 100 UNIT/ML IJ SOLN
INTRAMUSCULAR | Status: AC
  Filled 2023-05-09: qty 1

## 2023-05-09 MED ORDER — OXYCODONE HCL 5 MG/5ML PO SOLN
5.0000 mg | Freq: Once | ORAL | Status: AC | PRN
Start: 2023-05-09 — End: 2023-05-09

## 2023-05-09 MED ORDER — FENTANYL CITRATE (PF) 100 MCG/2ML IJ SOLN
25.0000 ug | INTRAMUSCULAR | Status: DC | PRN
  Administered 2023-05-09: 50 ug via INTRAVENOUS
  Administered 2023-05-09: 25 ug via INTRAVENOUS

## 2023-05-09 MED ORDER — BUPIVACAINE HCL 0.5 % IJ SOLN
INTRAMUSCULAR | Status: DC | PRN
  Administered 2023-05-09: 5 mL

## 2023-05-09 MED ORDER — SILVER NITRATE-POT NITRATE 75-25 % EX MISC
CUTANEOUS | Status: AC
  Filled 2023-05-09: qty 10

## 2023-05-09 MED ORDER — ACETAMINOPHEN 500 MG PO TABS
ORAL_TABLET | ORAL | Status: AC
  Filled 2023-05-09: qty 2

## 2023-05-09 MED ORDER — FENTANYL CITRATE (PF) 100 MCG/2ML IJ SOLN
INTRAMUSCULAR | Status: DC | PRN
  Administered 2023-05-09 (×2): 50 ug via INTRAVENOUS

## 2023-05-09 MED ORDER — SILVER NITRATE-POT NITRATE 75-25 % EX MISC
CUTANEOUS | Status: DC | PRN
  Administered 2023-05-09: 6

## 2023-05-09 MED ORDER — MIDAZOLAM HCL 2 MG/2ML IJ SOLN
INTRAMUSCULAR | Status: AC
  Filled 2023-05-09: qty 2

## 2023-05-09 MED ORDER — INSULIN ASPART 100 UNIT/ML IJ SOLN
3.0000 [IU] | Freq: Once | INTRAMUSCULAR | Status: AC
  Administered 2023-05-09: 3 [IU] via SUBCUTANEOUS

## 2023-05-09 MED ORDER — BUPIVACAINE HCL (PF) 0.5 % IJ SOLN
INTRAMUSCULAR | Status: AC
  Filled 2023-05-09: qty 30

## 2023-05-09 MED ORDER — SUGAMMADEX SODIUM 200 MG/2ML IV SOLN
INTRAVENOUS | Status: DC | PRN
  Administered 2023-05-09: 200 mg via INTRAVENOUS

## 2023-05-09 MED ORDER — DEXMEDETOMIDINE HCL IN NACL 80 MCG/20ML IV SOLN
INTRAVENOUS | Status: DC | PRN
Start: 2023-05-09 — End: 2023-05-09
  Administered 2023-05-09: 4 ug via INTRAVENOUS

## 2023-05-09 MED ORDER — GABAPENTIN 300 MG PO CAPS
ORAL_CAPSULE | ORAL | Status: AC
  Filled 2023-05-09: qty 1

## 2023-05-09 MED ORDER — OXYCODONE HCL 5 MG PO TABS
5.0000 mg | ORAL_TABLET | Freq: Once | ORAL | Status: AC | PRN
  Administered 2023-05-09: 5 mg via ORAL

## 2023-05-09 MED ORDER — ONDANSETRON HCL 4 MG/2ML IJ SOLN
INTRAMUSCULAR | Status: DC | PRN
  Administered 2023-05-09: 4 mg via INTRAVENOUS

## 2023-05-09 MED ORDER — OXYCODONE HCL 5 MG PO TABS: 5.0000 mg | ORAL_TABLET | ORAL | Status: DC | PRN

## 2023-05-09 MED ORDER — FENTANYL CITRATE (PF) 100 MCG/2ML IJ SOLN
INTRAMUSCULAR | Status: AC
  Filled 2023-05-09: qty 2

## 2023-05-09 MED ORDER — DROPERIDOL 2.5 MG/ML IJ SOLN
INTRAMUSCULAR | Status: AC
  Filled 2023-05-09: qty 2

## 2023-05-09 MED ORDER — DROPERIDOL 2.5 MG/ML IJ SOLN
0.6250 mg | Freq: Once | INTRAMUSCULAR | Status: AC
  Administered 2023-05-09: 0.625 mg via INTRAVENOUS

## 2023-05-09 MED ORDER — ONDANSETRON HCL 4 MG/2ML IJ SOLN
INTRAMUSCULAR | Status: AC
  Filled 2023-05-09: qty 2

## 2023-05-09 MED ORDER — PHENYLEPHRINE 80 MCG/ML (10ML) SYRINGE FOR IV PUSH (FOR BLOOD PRESSURE SUPPORT)
PREFILLED_SYRINGE | INTRAVENOUS | Status: AC
  Filled 2023-05-09: qty 10

## 2023-05-09 MED ORDER — LIDOCAINE HCL (CARDIAC) PF 100 MG/5ML IV SOSY
PREFILLED_SYRINGE | INTRAVENOUS | Status: DC | PRN
  Administered 2023-05-09 (×2): 80 mg via INTRAVENOUS

## 2023-05-09 MED ORDER — ROCURONIUM BROMIDE 10 MG/ML (PF) SYRINGE
PREFILLED_SYRINGE | INTRAVENOUS | Status: AC
  Filled 2023-05-09: qty 10

## 2023-05-09 MED ORDER — 0.9 % SODIUM CHLORIDE (POUR BTL) OPTIME
TOPICAL | Status: DC | PRN
  Administered 2023-05-09: 500 mL

## 2023-05-09 MED ORDER — LIDOCAINE HCL (PF) 2 % IJ SOLN
INTRAMUSCULAR | Status: AC
  Filled 2023-05-09: qty 5

## 2023-05-09 MED ORDER — PROPOFOL 10 MG/ML IV BOLUS
INTRAVENOUS | Status: AC
  Filled 2023-05-09: qty 20

## 2023-05-09 MED ORDER — PROPOFOL 10 MG/ML IV BOLUS
INTRAVENOUS | Status: DC | PRN
  Administered 2023-05-09: 150 mg via INTRAVENOUS

## 2023-05-09 MED ORDER — KETOROLAC TROMETHAMINE 30 MG/ML IJ SOLN
INTRAMUSCULAR | Status: DC | PRN
  Administered 2023-05-09: 30 mg via INTRAVENOUS

## 2023-05-09 MED ORDER — OXYCODONE HCL 5 MG PO TABS
ORAL_TABLET | ORAL | Status: AC
  Filled 2023-05-09: qty 1

## 2023-05-09 MED ORDER — ONDANSETRON 4 MG PO TBDP: 4.0000 mg | ORAL_TABLET | Freq: Four times a day (QID) | ORAL | Status: DC | PRN

## 2023-05-09 MED ORDER — PHENYLEPHRINE 80 MCG/ML (10ML) SYRINGE FOR IV PUSH (FOR BLOOD PRESSURE SUPPORT)
PREFILLED_SYRINGE | INTRAVENOUS | Status: DC | PRN
  Administered 2023-05-09 (×2): 80 ug via INTRAVENOUS
  Administered 2023-05-09: 160 ug via INTRAVENOUS
  Administered 2023-05-09: 80 ug via INTRAVENOUS

## 2023-05-09 SURGICAL SUPPLY — 41 items
APL PRP STRL LF DISP 70% ISPRP (MISCELLANEOUS) ×1
APR BAND 32X8 2 INCS BAND (Ring) ×1 IMPLANT
BLADE SURG SZ11 CARB STEEL (BLADE) ×1 IMPLANT
CATH ROBINSON RED A/P 16FR (CATHETERS) ×1 IMPLANT
CHLORAPREP W/TINT 26 (MISCELLANEOUS) ×1 IMPLANT
DRSG TEGADERM 2-3/8X2-3/4 SM (GAUZE/BANDAGES/DRESSINGS) ×2 IMPLANT
GAUZE 4X4 16PLY ~~LOC~~+RFID DBL (SPONGE) ×1 IMPLANT
GAUZE SPONGE 2X2 STRL 8-PLY (GAUZE/BANDAGES/DRESSINGS) ×1 IMPLANT
GLOVE SURG SYN 8.0 (GLOVE) ×1 IMPLANT
GLOVE SURG SYN 8.0 PF PI (GLOVE) ×1 IMPLANT
GOWN STRL REUS W/ TWL LRG LVL3 (GOWN DISPOSABLE) ×1 IMPLANT
GOWN STRL REUS W/ TWL XL LVL3 (GOWN DISPOSABLE) ×2 IMPLANT
GOWN STRL REUS W/TWL LRG LVL3 (GOWN DISPOSABLE) ×1
GOWN STRL REUS W/TWL XL LVL3 (GOWN DISPOSABLE) ×2
GRASPER SUT TROCAR 14GX15 (MISCELLANEOUS) IMPLANT
KIT DISPOSABLE FALLOPE RING (Ring) ×1 IMPLANT
KIT PINK PAD W/HEAD ARE REST (MISCELLANEOUS) ×1
KIT PINK PAD W/HEAD ARM REST (MISCELLANEOUS) ×1 IMPLANT
KIT TURNOVER CYSTO (KITS) ×1 IMPLANT
LABEL OR SOLS (LABEL) ×1 IMPLANT
MANIFOLD NEPTUNE II (INSTRUMENTS) ×1 IMPLANT
NDL HYPO 22X1.5 SAFETY MO (MISCELLANEOUS) ×1 IMPLANT
NEEDLE HYPO 22X1.5 SAFETY MO (MISCELLANEOUS) ×1 IMPLANT
NS IRRIG 500ML POUR BTL (IV SOLUTION) ×1 IMPLANT
PACK GYN LAPAROSCOPIC (MISCELLANEOUS) ×1 IMPLANT
PAD OB MATERNITY 4.3X12.25 (PERSONAL CARE ITEMS) ×1 IMPLANT
PAD PREP OB/GYN DISP 24X41 (PERSONAL CARE ITEMS) ×1 IMPLANT
SCRUB CHG 4% DYNA-HEX 4OZ (MISCELLANEOUS) ×1 IMPLANT
SET TUBE SMOKE EVAC HIGH FLOW (TUBING) ×1 IMPLANT
SHEARS HARMONIC 36 ACE (MISCELLANEOUS) IMPLANT
SLEEVE Z-THREAD 5X100MM (TROCAR) IMPLANT
STRIP CLOSURE SKIN 1/4X4 (GAUZE/BANDAGES/DRESSINGS) ×1 IMPLANT
SUT VIC AB 0 CT1 36 (SUTURE) IMPLANT
SUT VIC AB 2-0 UR6 27 (SUTURE) IMPLANT
SUT VIC AB 4-0 SH 27 (SUTURE) ×1
SUT VIC AB 4-0 SH 27XANBCTRL (SUTURE) ×1 IMPLANT
SWABSTK COMLB BENZOIN TINCTURE (MISCELLANEOUS) IMPLANT
TRAP FLUID SMOKE EVACUATOR (MISCELLANEOUS) ×1 IMPLANT
TROCAR Z-THRD FIOS HNDL 11X100 (TROCAR) IMPLANT
TROCAR Z-THREAD FIOS 5X100MM (TROCAR) ×1 IMPLANT
WATER STERILE IRR 500ML POUR (IV SOLUTION) ×1 IMPLANT

## 2023-05-09 NOTE — Plan of Care (Signed)
 [facility] Tonsillectomy/Adenoidectomy, Postoperative PEDS care plan entered in error.

## 2023-05-09 NOTE — Anesthesia Procedure Notes (Signed)
Procedure Name: Intubation Date/Time: 05/09/2023 7:49 AM  Performed by: Elmarie Mainland, CRNAPre-anesthesia Checklist: Patient identified, Emergency Drugs available, Suction available and Patient being monitored Patient Re-evaluated:Patient Re-evaluated prior to induction Oxygen Delivery Method: Circle system utilized Preoxygenation: Pre-oxygenation with 100% oxygen Induction Type: IV induction Ventilation: Mask ventilation without difficulty and Oral airway inserted - appropriate to patient size Laryngoscope Size: McGraph and 3 Grade View: Grade I Tube type: Oral Tube size: 6.5 mm Number of attempts: 1 Airway Equipment and Method: Stylet and Video-laryngoscopy Placement Confirmation: ETT inserted through vocal cords under direct vision, positive ETCO2 and breath sounds checked- equal and bilateral Secured at: 22 cm Tube secured with: Tape Dental Injury: Teeth and Oropharynx as per pre-operative assessment

## 2023-05-09 NOTE — Progress Notes (Signed)
Patient arrived to the Post op unit with nausea and vomiting. This nurse applied a cool cloth to her neck and forehead. After about 5 minutes she stated the nausea had decreased greatly. The patient was instructed to practice deep breathing exercises to reduce the feeling of nausea and vomiting when she is home. It was also suggested that the patient start with a light food such as soups and crackers and gradually progress to heavier foods. The patient and husband verbalized understanding.

## 2023-05-09 NOTE — Discharge Instructions (Signed)

## 2023-05-09 NOTE — Anesthesia Preprocedure Evaluation (Addendum)
Anesthesia Evaluation  Patient identified by MRN, date of birth, ID band Patient awake    Reviewed: Allergy & Precautions, NPO status , Patient's Chart, lab work & pertinent test results  History of Anesthesia Complications Negative for: history of anesthetic complications  Airway Mallampati: II  TM Distance: >3 FB Neck ROM: full    Dental no notable dental hx.    Pulmonary neg pulmonary ROS   Pulmonary exam normal        Cardiovascular hypertension, On Medications Normal cardiovascular exam     Neuro/Psych negative neurological ROS  negative psych ROS   GI/Hepatic negative GI ROS, Neg liver ROS,,,  Endo/Other  diabetes, Type 2, Oral Hypoglycemic Agents    Renal/GU      Musculoskeletal   Abdominal   Peds  Hematology negative hematology ROS (+)   Anesthesia Other Findings Past Medical History: No date: Diabetes mellitus without complication (HCC) No date: Hypertension 08/17/2015: Labor and delivery indication for care or intervention 08/17/2015: Oligohydramnios, antepartum 08/18/2015: Postpartum care following vaginal delivery  Past Surgical History: No date: NO PAST SURGERIES  BMI    Body Mass Index: 28.50 kg/m      Reproductive/Obstetrics negative OB ROS                             Anesthesia Physical Anesthesia Plan  ASA: 2  Anesthesia Plan: General ETT   Post-op Pain Management: Tylenol PO (pre-op)*, Gabapentin PO (pre-op)* and Toradol IV (intra-op)*   Induction: Intravenous  PONV Risk Score and Plan: 4 or greater and Ondansetron, Dexamethasone, Midazolam and Treatment may vary due to age or medical condition  Airway Management Planned: Oral ETT  Additional Equipment:   Intra-op Plan:   Post-operative Plan: Extubation in OR  Informed Consent: I have reviewed the patients History and Physical, chart, labs and discussed the procedure including the risks,  benefits and alternatives for the proposed anesthesia with the patient or authorized representative who has indicated his/her understanding and acceptance.     Dental Advisory Given  Plan Discussed with: Anesthesiologist, CRNA and Surgeon  Anesthesia Plan Comments: (Patient consented for risks of anesthesia including but not limited to:  - adverse reactions to medications - damage to eyes, teeth, lips or other oral mucosa - nerve damage due to positioning  - sore throat or hoarseness - Damage to heart, brain, nerves, lungs, other parts of body or loss of life  Patient voiced understanding and assent.)        Anesthesia Quick Evaluation

## 2023-05-09 NOTE — Anesthesia Postprocedure Evaluation (Signed)
Anesthesia Post Note  Patient: Diana [name]  Procedure(s) Performed: LAPAROSCOPIC TUBAL LIGATION WITH FALOPE RINGS (Bilateral: Abdomen)  Patient location during evaluation: PACU Anesthesia Type: General Level of consciousness: awake and alert Pain management: pain level controlled Vital Signs Assessment: post-procedure vital signs reviewed and stable Respiratory status: spontaneous breathing, nonlabored ventilation, respiratory function stable and patient connected to nasal cannula oxygen Cardiovascular status: blood pressure returned to baseline and stable Postop Assessment: no apparent nausea or vomiting Anesthetic complications: no   No notable events documented.   Last Vitals:  Vitals:   05/09/23 0930 05/09/23 0945  BP: 114/83 111/81  Pulse: 79 78  Resp: 15 17  Temp:  (!) 36.1 C  SpO2: 97% 98%    Last Pain:  Vitals:   05/09/23 0930  TempSrc:   PainSc: Asleep                 Louie Boston

## 2023-05-09 NOTE — Progress Notes (Signed)
Pt blood sugar 223. Per Dr Dorcas Carrow, give 3 units novolog subcutaneous. Orders placed.

## 2023-05-09 NOTE — Progress Notes (Signed)
Pt here for l/s BTl . All labs reviewed . Neg Hcg . All questions answered . Proceed

## 2023-05-09 NOTE — Op Note (Signed)
Diana [name], Diana [name] MEDICAL RECORD NO: [id number] ACCOUNT NO: [id number] DATE OF BIRTH: [birth year] FACILITY: [facility] LOCATION: [facility] PHYSICIAN: Suzy Bouchard, MD  Operative Report   DATE OF PROCEDURE: 05/09/2023  PREOPERATIVE DIAGNOSIS:  Elective permanent sterilization.  POSTOPERATIVE DIAGNOSIS:  Elective permanent sterilization.  PROCEDURE:  Laparoscopic bilateral tubal ligation Falope rings, cautery.  SURGEON:  Suzy Bouchard, MD  ANESTHESIA:  General endotracheal anesthesia.  INDICATIONS:  A 37 year old gravida 4, para 4, patient is status post spontaneous vaginal delivery and has desired on elective permanent sterilization.  The patient is aware of failure rate of 1 per 300.  DESCRIPTION OF PROCEDURE:  After adequate general endotracheal anesthesia, the patient was placed in dorsal supine position with legs in the Herndon stirrups.  The patient's abdomen, perineum and vagina were prepped and draped in normal sterile fashion.   Timeout was performed.  Straight catheterization of the bladder yielded 100 mL clear urine.  Speculum was placed in the vagina and the anterior cervix was grasped with a single tooth tenaculum and a Cohen cannula was placed in the endocervix to be used  for uterine manipulation during the procedure.  Gloves and gown were changed.  Attention was directed to the patient's abdomen where a 5 mm infraumbilical incision was made after injection with 0.5% Marcaine.  The 5 mm laparoscope was advanced under  direct visualization with the Optiview cannula.  The patient's abdomen was insufflated.  Second port placement was placed 2 cm cephalad to the symphysis pubis, 7 mm Falope ring trocar was advanced under direct visualization.  Attention was directed to  the patient's right fallopian tube, which was identified in its entirety.  Falope ring was applied at the isthmic ampullary portion of fallopian tube with a 1 cm portion of fallopian tube.  Given  the size of the knuckle of fallopian tube surgeon decided  to cauterize the tube in 2 separate areas to ensure sterilization.  Similar procedure was repeated on the patient's left fallopian tube after identifying the fallopian tube in its entirety.  A Falope ring was applied to the isthmic ampullary portion of  fallopian tube and 2 separate cautery areas were performed as well.  Pictures were taken.  The patient's abdomen was deflated and all instruments were removed.  Skin was reapproximated with interrupted 4-0 Vicryl suture.  Sterile dressing applied.  The  single tooth tenaculum and Cohen cannula were removed and silver nitrate was applied to the tenacula sites.  Good hemostasis noted.  There were no complications.  The patient did receive 30 mg Toradol at the end of the procedure.  INTRAOPERATIVE FLUIDS:  900 mL.  URINE OUTPUT:  100 mL.  ESTIMATED BLOOD LOSS:  5 mL.  DISPOSITION:  The patient was taken to recovery room in good condition.   PUS D: 05/09/2023 9:19:02 am T: 05/09/2023 9:29:00 am  JOB: [id number]/ [id number]

## 2023-05-09 NOTE — Brief Op Note (Signed)
05/09/2023  8:59 AM  PATIENT:  Mickel Duhamel  37 y.o. female  PRE-OPERATIVE DIAGNOSIS:  elective sterilization  POST-OPERATIVE DIAGNOSIS:  elective sterilization  PROCEDURE:  Procedure(s): LAPAROSCOPIC TUBAL LIGATION WITH FALOPE RINGS (Bilateral) And cautery bilaterally  SURGEON:  Surgeons and Role:    * Kollen Armenti, Ihor Austin, MD - Primary  PHYSICIAN ASSISTANT:   ASSISTANTS: none   ANESTHESIA:   general  EBL:  5 mL iof 900 cc uo 100 cc  BLOOD ADMINISTERED:none  DRAINS: none   LOCAL MEDICATIONS USED:  MARCAINE     SPECIMEN:  No Specimen  DISPOSITION OF SPECIMEN:  N/A  COUNTS:  YES  TOURNIQUET:  * No tourniquets in log *  DICTATION: .Other Dictation: Dictation Number verbal  PLAN OF CARE: Discharge to home after PACU  PATIENT DISPOSITION:  PACU - hemodynamically stable.   Delay start of Pharmacological VTE agent (>24hrs) due to surgical blood loss or risk of bleeding: not applicable

## 2023-05-09 NOTE — Transfer of Care (Signed)
Immediate Anesthesia Transfer of Care Note  Patient: Diana [name]  Procedure(s) Performed: LAPAROSCOPIC TUBAL LIGATION WITH FALOPE RINGS (Bilateral: Abdomen)  Patient Location: PACU  Anesthesia Type:General  Level of Consciousness: awake, drowsy, and patient cooperative  Airway & Oxygen Therapy: Patient Spontanous Breathing  Post-op Assessment: Report given to RN and Post -op Vital signs reviewed and stable  Post vital signs: Reviewed and stable  Last Vitals:  Vitals Value Taken Time  BP 124/91 05/09/23 0912  Temp 36.3 C 05/09/23 0912  Pulse 97 05/09/23 0915  Resp 23 05/09/23 0915  SpO2 100 % 05/09/23 0915  Vitals shown include unfiled device data.  Last Pain:  Vitals:   05/09/23 0912  TempSrc:   PainSc: Asleep         Complications: No notable events documented.

## 2023-07-06 IMAGING — US US TRANSVAGINAL NON-OB
3 series · 14 of 25 positions shown · non-contrast
Comparison: 08/05/2016

CLINICAL DATA: Suprapubic abdominal pain

EXAM:
ULTRASOUND PELVIS TRANSVAGINAL
TECHNIQUE: Transvaginal ultrasound examination of the pelvis was performed
including evaluation of the uterus, ovaries, adnexal regions, and
pelvic cul-de-sac.

[Series 1: us pelvis transvaginal non-ob (tv only) · 12 of 52 slices shown]
[im 1/52]
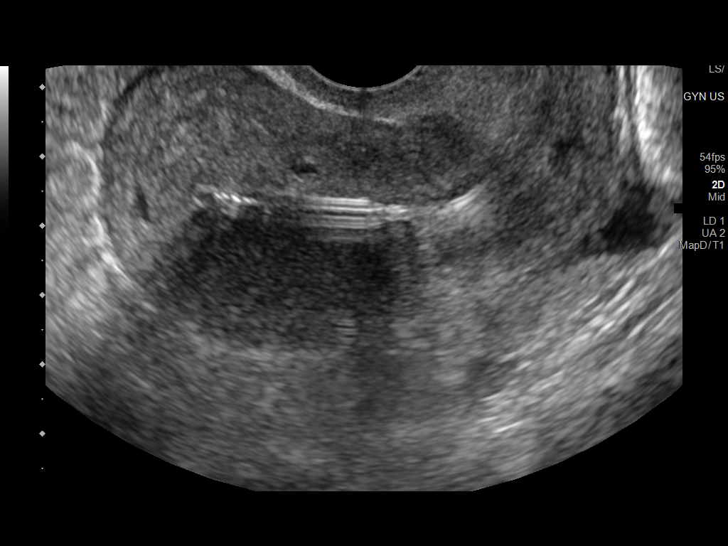
[im 5/52]
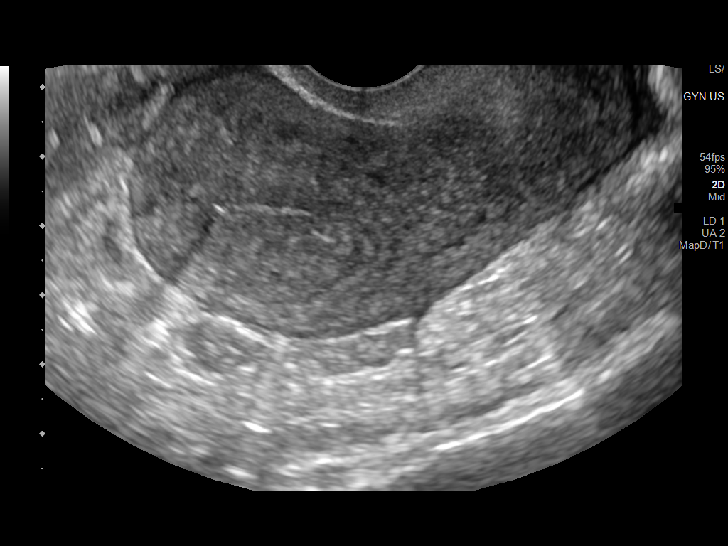
[im 10/52]
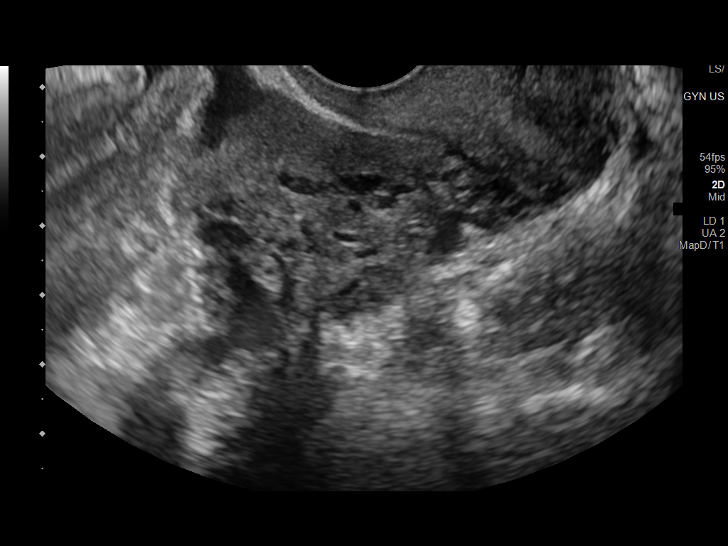
[im 15/52]
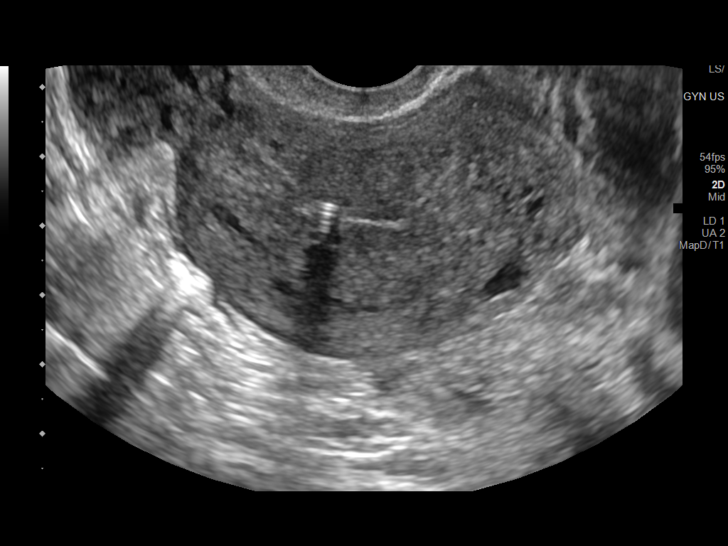
[im 20/52]
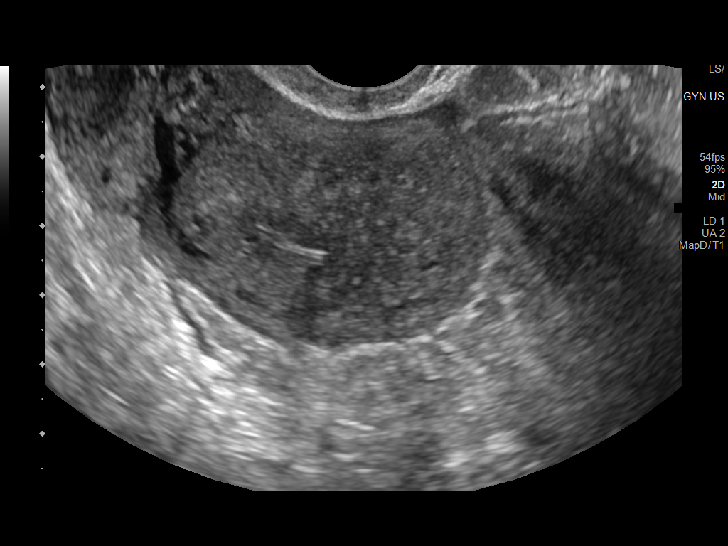
[im 22/52]
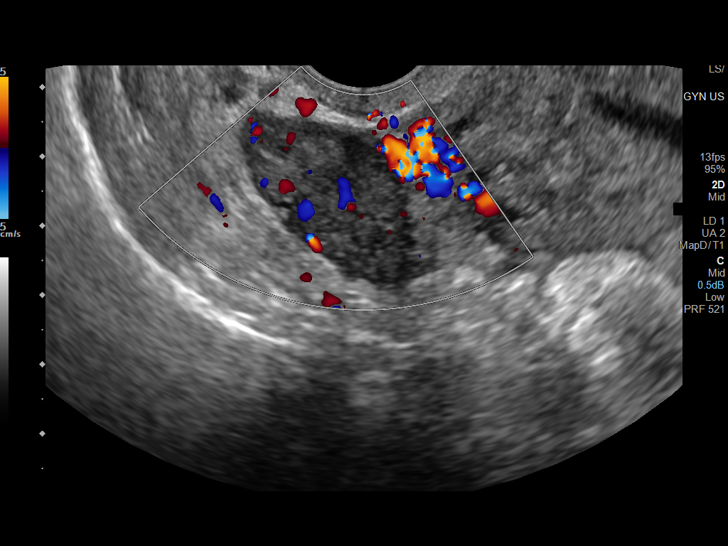
[im 27/52]
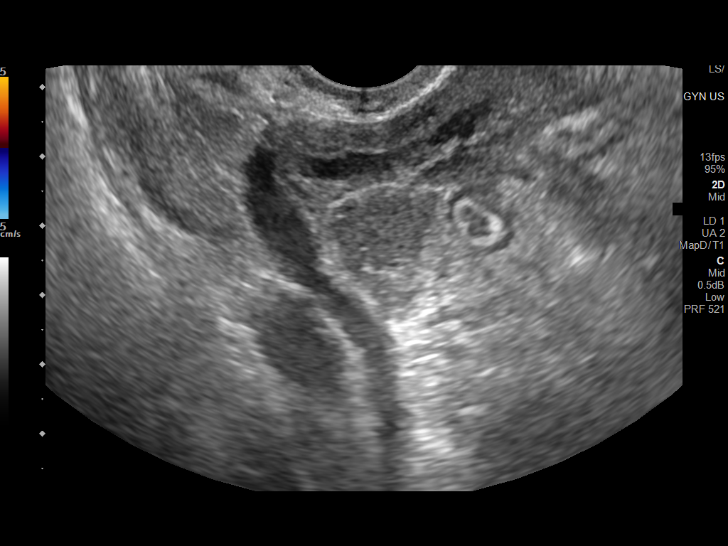
[im 32/52]
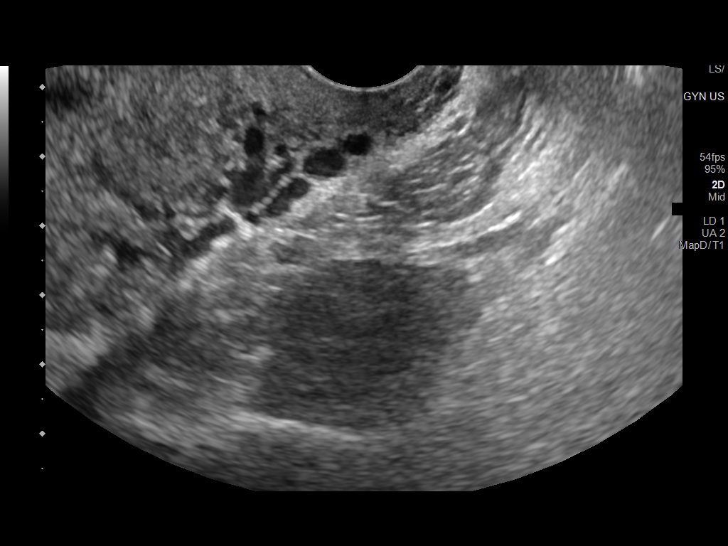
[im 37/52]
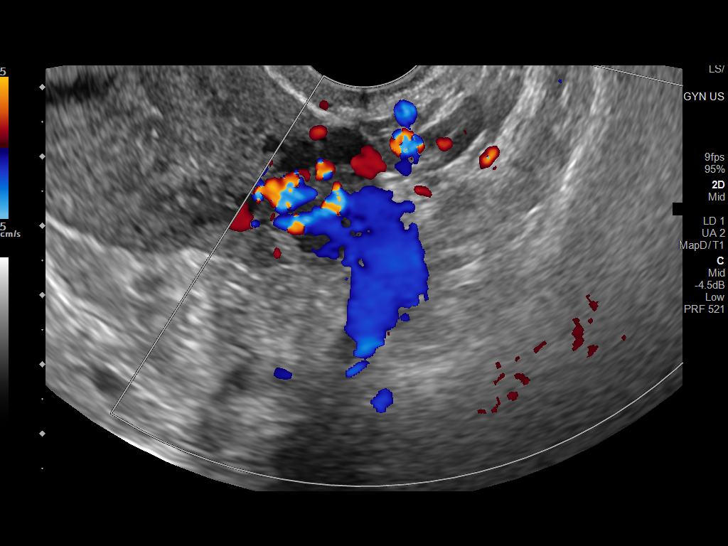
[im 39/52]
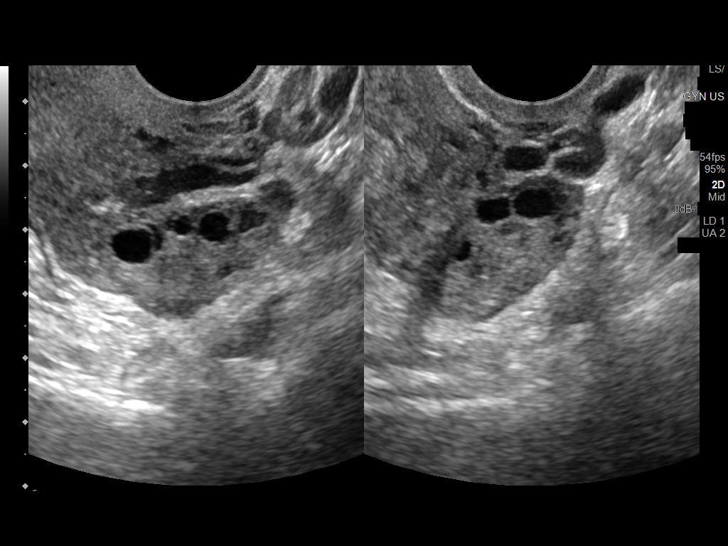
[im 44/52]
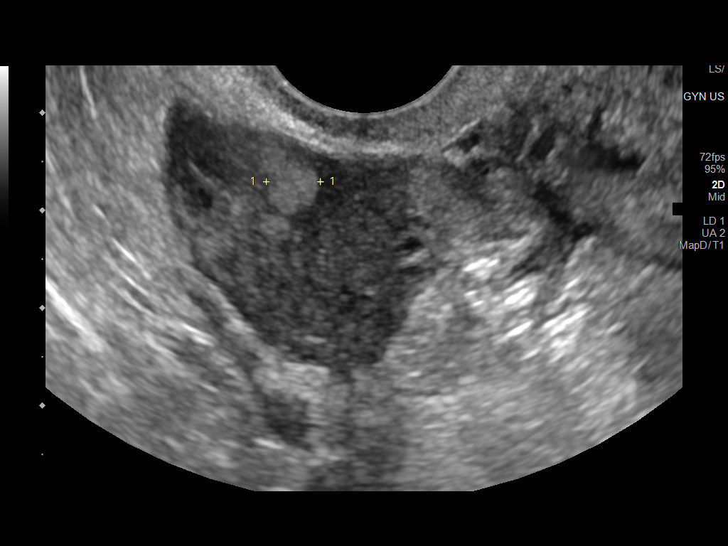
[im 49/52]
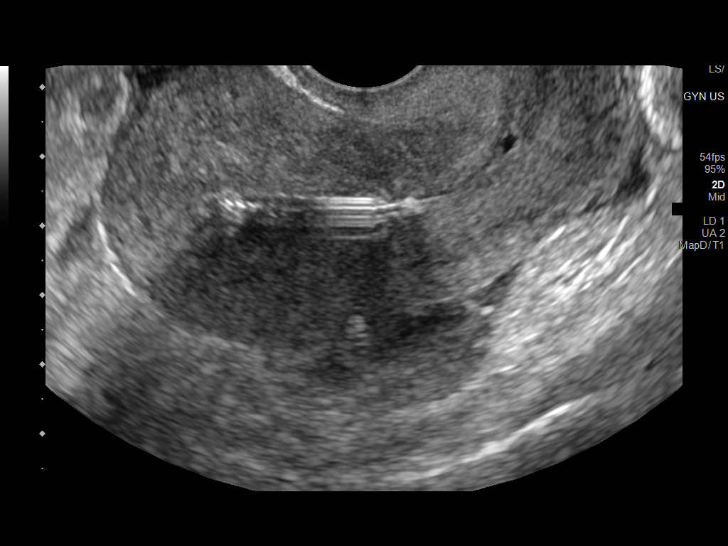

[Series 1001: gyn us · 1 of 4 slices shown (1 of 2)]
[im 1/4]
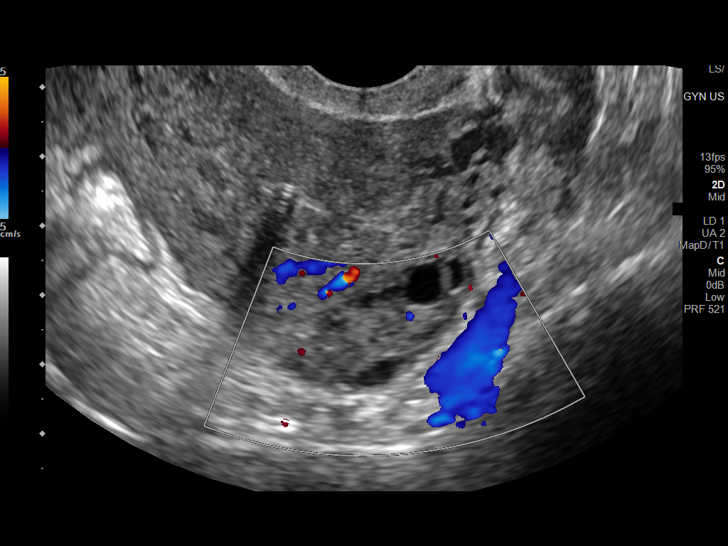

[Series 1002: gyn us · 1 of 1 slices shown (2 of 2)]
[im 1/1]
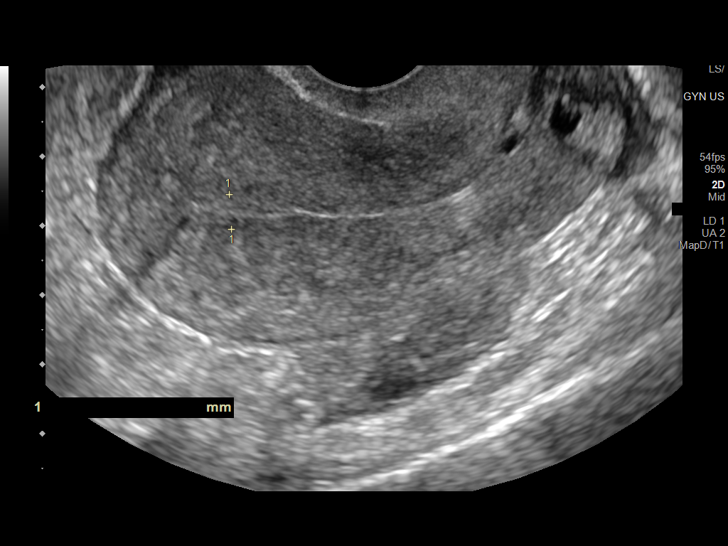

[14 of 25 positions shown; findings below may reference images not displayed]

FINDINGS: Uterus

Measurements: 7.6 x 4.2 x 5.8 cm = volume: 96.8 mL. No fibroids or
other mass visualized.

Endometrium

Thickness: 5 mm.  No abnormality visualized.  IUD is identified.

Right ovary

Measurements: 2.9 x 1.9 x 3.2 cm = volume: 9.5 mL. Normal
appearance/no adnexal mass.

Left ovary

Measurements: 3.0 x 1.7 x 2.8 cm = volume: 7.1 mL. Normal
appearance/no adnexal mass.

Other findings:  Trace free fluid is likely physiologic.
IMPRESSION: 1. IUD within the endometrial cavity.
2. Age-appropriate pelvic ultrasound.
